# Patient Record
Sex: Female | Born: 1965
Health system: Southern US, Community
[De-identification: ages and names within clinical notes are randomized; demographics above are authoritative.]

---

## 2012-01-09 ENCOUNTER — Ambulatory Visit: Payer: Self-pay | Admitting: Internal Medicine

## 2016-03-10 ENCOUNTER — Other Ambulatory Visit: Payer: Self-pay | Admitting: Internal Medicine

## 2016-03-10 DIAGNOSIS — Z1231 Encounter for screening mammogram for malignant neoplasm of breast: Secondary | ICD-10-CM

## 2016-03-22 ENCOUNTER — Ambulatory Visit
Admission: RE | Admit: 2016-03-22 | Discharge: 2016-03-22 | Disposition: A | Discharge status: Home / Self Care | Payer: Managed Care, Other (non HMO) | Source: Ambulatory Visit | Attending: Internal Medicine | Admitting: Internal Medicine

## 2016-03-22 DIAGNOSIS — R928 Other abnormal and inconclusive findings on diagnostic imaging of breast: Secondary | ICD-10-CM | POA: Insufficient documentation

## 2016-03-22 DIAGNOSIS — Z1231 Encounter for screening mammogram for malignant neoplasm of breast: Secondary | ICD-10-CM

## 2016-03-27 ENCOUNTER — Other Ambulatory Visit: Payer: Self-pay | Admitting: Internal Medicine

## 2016-03-27 DIAGNOSIS — R928 Other abnormal and inconclusive findings on diagnostic imaging of breast: Secondary | ICD-10-CM

## 2016-04-03 ENCOUNTER — Ambulatory Visit
Admission: RE | Admit: 2016-04-03 | Discharge: 2016-04-03 | Disposition: A | Discharge status: Home / Self Care | Payer: Managed Care, Other (non HMO) | Source: Ambulatory Visit | Attending: Internal Medicine | Admitting: Internal Medicine

## 2016-04-03 DIAGNOSIS — N6001 Solitary cyst of right breast: Secondary | ICD-10-CM | POA: Insufficient documentation

## 2016-04-03 DIAGNOSIS — R928 Other abnormal and inconclusive findings on diagnostic imaging of breast: Secondary | ICD-10-CM

## 2016-04-03 DIAGNOSIS — N6002 Solitary cyst of left breast: Secondary | ICD-10-CM | POA: Insufficient documentation

## 2016-04-03 DIAGNOSIS — N63 Unspecified lump in breast: Secondary | ICD-10-CM | POA: Insufficient documentation

## 2016-04-03 DIAGNOSIS — N6489 Other specified disorders of breast: Secondary | ICD-10-CM | POA: Insufficient documentation

## 2018-08-20 ENCOUNTER — Other Ambulatory Visit: Payer: Self-pay | Admitting: Internal Medicine

## 2018-08-20 DIAGNOSIS — Z09 Encounter for follow-up examination after completed treatment for conditions other than malignant neoplasm: Secondary | ICD-10-CM

## 2018-09-04 ENCOUNTER — Encounter: Payer: Self-pay | Admitting: *Deleted

## 2018-12-03 DIAGNOSIS — J209 Acute bronchitis, unspecified: Secondary | ICD-10-CM | POA: Diagnosis not present

## 2018-12-03 DIAGNOSIS — R1310 Dysphagia, unspecified: Secondary | ICD-10-CM | POA: Diagnosis not present

## 2018-12-03 DIAGNOSIS — E8881 Metabolic syndrome: Secondary | ICD-10-CM | POA: Diagnosis not present

## 2019-02-12 DIAGNOSIS — E8881 Metabolic syndrome: Secondary | ICD-10-CM | POA: Diagnosis not present

## 2019-02-12 DIAGNOSIS — I119 Hypertensive heart disease without heart failure: Secondary | ICD-10-CM | POA: Diagnosis not present

## 2019-07-24 DIAGNOSIS — I119 Hypertensive heart disease without heart failure: Secondary | ICD-10-CM | POA: Diagnosis not present

## 2019-07-24 DIAGNOSIS — R1310 Dysphagia, unspecified: Secondary | ICD-10-CM | POA: Diagnosis not present

## 2019-07-24 DIAGNOSIS — E8881 Metabolic syndrome: Secondary | ICD-10-CM | POA: Diagnosis not present

## 2019-10-23 DIAGNOSIS — E669 Obesity, unspecified: Secondary | ICD-10-CM | POA: Diagnosis not present

## 2019-10-23 DIAGNOSIS — F329 Major depressive disorder, single episode, unspecified: Secondary | ICD-10-CM | POA: Diagnosis not present

## 2019-10-23 DIAGNOSIS — R1310 Dysphagia, unspecified: Secondary | ICD-10-CM | POA: Diagnosis not present

## 2019-10-23 DIAGNOSIS — Z23 Encounter for immunization: Secondary | ICD-10-CM | POA: Diagnosis not present

## 2019-10-23 DIAGNOSIS — F419 Anxiety disorder, unspecified: Secondary | ICD-10-CM | POA: Diagnosis not present

## 2019-12-12 ENCOUNTER — Ambulatory Visit: Payer: Managed Care, Other (non HMO) | Attending: Internal Medicine

## 2019-12-12 DIAGNOSIS — Z20822 Contact with and (suspected) exposure to covid-19: Secondary | ICD-10-CM | POA: Diagnosis not present

## 2019-12-13 LAB — NOVEL CORONAVIRUS, NAA: SARS-CoV-2, NAA: NOT DETECTED

## 2020-08-01 DIAGNOSIS — Z20822 Contact with and (suspected) exposure to covid-19: Secondary | ICD-10-CM | POA: Diagnosis not present

## 2020-10-15 ENCOUNTER — Other Ambulatory Visit: Payer: Self-pay

## 2020-10-15 ENCOUNTER — Ambulatory Visit (INDEPENDENT_AMBULATORY_CARE_PROVIDER_SITE_OTHER): Payer: BC Managed Care – PPO | Admitting: Internal Medicine

## 2020-10-15 ENCOUNTER — Encounter: Payer: Self-pay | Admitting: Internal Medicine

## 2020-10-15 VITALS — BP 131/76 | HR 90 | Ht 64.0 in | Wt 224.3 lb

## 2020-10-15 DIAGNOSIS — E669 Obesity, unspecified: Secondary | ICD-10-CM | POA: Diagnosis not present

## 2020-10-15 DIAGNOSIS — R609 Edema, unspecified: Secondary | ICD-10-CM

## 2020-10-15 DIAGNOSIS — I10 Essential (primary) hypertension: Secondary | ICD-10-CM | POA: Insufficient documentation

## 2020-10-15 DIAGNOSIS — J301 Allergic rhinitis due to pollen: Secondary | ICD-10-CM | POA: Diagnosis not present

## 2020-10-15 MED ORDER — LISINOPRIL 20 MG PO TABS: 20.0000 mg | ORAL_TABLET | Freq: Every day | ORAL | 3 refills | Status: DC

## 2020-10-15 MED ORDER — LISINOPRIL-HYDROCHLOROTHIAZIDE 10-12.5 MG PO TABS: 1.0000 | ORAL_TABLET | Freq: Every day | ORAL | 3 refills | Status: DC

## 2020-10-15 NOTE — Assessment & Plan Note (Signed)
-   Today, the patient's blood pressure is well managed on lisinopril. - The patient will continue the current treatment regimen.  - I encouraged the patient to eat a low-sodium diet to help control blood pressure. - I encouraged the patient to live an active lifestyle and complete activities that increases heart rate to 85% target heart rate at least 5 times per week for one hour.     

## 2020-10-15 NOTE — Assessment & Plan Note (Signed)
Patient was advised to use over-the-counter Claritin follow-up allergic rhinitis.

## 2020-10-15 NOTE — Progress Notes (Signed)
Established Patient Office Visit  Subjective:  Patient ID: Sydney Nguyen, female    DOB: [birth year]  Age: 54 y.o. MRN: [id number]  CC:  Chief Complaint  Patient presents with   Edema    feet and ankles    Hypertension    HPI  Sydney Nguyen presents for swelling of the leg and pain in the feet there is no history of any numbness of the feet she can walk all day long and get tired and fatigue of the end of the day she is taking lisinopril 20 mg p.o. daily now.  She has gained some weight lately denies any history of arterial rectal bleeding  History reviewed. No pertinent past medical history.  History reviewed. No pertinent surgical history.  Family History  Problem Relation Age of Onset   Breast cancer Neg Hx     Social History   Socioeconomic History   Marital status: Married    Spouse name: Not on file   Number of children: Not on file   Years of education: Not on file   Highest education level: Not on file  Occupational History   Not on file  Tobacco Use   Smoking status: Never Smoker   Smokeless tobacco: Never Used  Substance and Sexual Activity   Alcohol use: Never   Drug use: Never   Sexual activity: Yes  Other Topics Concern   Not on file  Social History Narrative   Not on file   Social Determinants of Health   Financial Resource Strain:    Difficulty of Paying Living Expenses: Not on file  Food Insecurity:    Worried About [place] in the Last Year: Not on file   Ran Out of Food in the Last Year: Not on file  Transportation Needs:    Lack of Transportation (Medical): Not on file   Lack of Transportation (Non-Medical): Not on file  Physical Activity:    Days of Exercise per Week: Not on file   Minutes of Exercise per Session: Not on file  Stress:    Feeling of Stress : Not on file  Social Connections:    Frequency of Communication with Friends and Family: Not on file   Frequency of Social Gatherings with  Friends and Family: Not on file   Attends Religious Services: Not on file   Active Member of Clubs or Organizations: Not on file   Attends Archivist Meetings: Not on file   Marital Status: Not on file  Intimate Partner Violence:    Fear of Current or Ex-Partner: Not on file   Emotionally Abused: Not on file   Physically Abused: Not on file   Sexually Abused: Not on file     Current Outpatient Medications:    lisinopril-hydrochlorothiazide (ZESTORETIC) 10-12.5 MG tablet, Take 1 tablet by mouth daily., Disp: 90 tablet, Rfl: 3   No Known Allergies  ROS Review of Systems  Constitutional: Negative.   HENT: Negative.  Negative for sinus pressure.   Eyes: Negative.   Respiratory: Negative.  Negative for chest tightness and shortness of breath.   Cardiovascular: Negative.   Gastrointestinal: Negative.   Endocrine: Negative.   Genitourinary: Negative.  Negative for dysuria.  Musculoskeletal: Positive for arthralgias.  Skin: Negative.   Allergic/Immunologic: Negative.   Neurological: Negative.  Negative for syncope and headaches.  Hematological: Negative.   Psychiatric/Behavioral: Negative.  Negative for behavioral problems.  All other systems reviewed and are negative.     Objective:  Physical Exam Vitals reviewed.  Constitutional:      Appearance: Normal appearance.  HENT:     Mouth/Throat:     Mouth: Mucous membranes are moist.  Eyes:     Pupils: Pupils are equal, round, and reactive to light.  Neck:     Vascular: No carotid bruit.  Cardiovascular:     Rate and Rhythm: Normal rate and regular rhythm.     Pulses: Normal pulses.     Heart sounds: Normal heart sounds. No friction rub. No gallop.   Pulmonary:     Effort: Pulmonary effort is normal.     Breath sounds: Normal breath sounds. No rhonchi.  Abdominal:     General: Bowel sounds are normal.     Palpations: Abdomen is soft. There is no hepatomegaly, splenomegaly or mass.     Tenderness:  There is no abdominal tenderness.     Hernia: No hernia is present.  Musculoskeletal:        General: Swelling present. No tenderness. Normal range of motion.     Cervical back: Neck supple.     Right lower leg: Edema present.     Left lower leg: Edema present.  Skin:    General: Skin is warm and dry.     Capillary Refill: Capillary refill takes 2 to 3 seconds.     Coloration: Skin is not jaundiced.     Findings: No bruising, lesion or rash.  Neurological:     Mental Status: She is alert and oriented to person, place, and time.     Motor: No weakness.  Psychiatric:        Mood and Affect: Mood and affect normal.        Behavior: Behavior normal.     BP 131/76    Pulse 90    Ht _0  (1.626 m)    Wt 224 lb 4.8 oz (101.7 kg)    BMI 38.50 kg/m  Wt Readings from Last 3 Encounters:  10/15/20 224 lb 4.8 oz (101.7 kg)     Health Maintenance Due  Topic Date Due   Hepatitis C Screening  Never done   COVID-19 Vaccine (1) Never done   HIV Screening  Never done   TETANUS/TDAP  Never done   PAP SMEAR-Modifier  Never done   MAMMOGRAM  06/17/2018   COLONOSCOPY  Never done   INFLUENZA VACCINE  Never done    There are no preventive care reminders to display for this patient.  No results found for: TSH No results found for: WBC, HGB, HCT, MCV, PLT No results found for: NA, K, CHLORIDE, CO2, GLUCOSE, BUN, CREATININE, BILITOT, ALKPHOS, AST, ALT, PROT, ALBUMIN, CALCIUM, ANIONGAP, EGFR, GFR No results found for: CHOL No results found for: HDL No results found for: LDLCALC No results found for: TRIG No results found for: CHOLHDL No results found for: HGBA1C    Assessment & Plan:   Problem List Items Addressed This Visit      Cardiovascular and Mediastinum   Essential hypertension - Primary    - Today, the patient's blood pressure is well managed on lisinopril. - The patient will continue the current treatment regimen.  - I encouraged the patient to eat a low-sodium diet  to help control blood pressure. - I encouraged the patient to live an active lifestyle and complete activities that increases heart rate to 85% target heart rate at least 5 times per week for one hour.          Relevant  Medications   lisinopril-hydrochlorothiazide (ZESTORETIC) 10-12.5 MG tablet     Respiratory   Seasonal allergic rhinitis due to pollen    Patient was advised to use over-the-counter Claritin follow-up allergic rhinitis.        Other   Edema    We will start the patient on lisinopril with hydrochlorothiazide to protect of excessive fluid.  She was also advised to take 1000 units of vitamin D every day.      Obesity (BMI 35.0-39.9 without comorbidity)    - I encouraged the patient to lose weight.  - I educated them on making healthy dietary choices including eating more fruits and vegetables and less fried foods. - I encouraged the patient to exercise more, and educated on the benefits of exercise including weight loss, diabetes prevention, and hypertension prevention.           Meds ordered this encounter  Medications   DISCONTD: lisinopril (ZESTRIL) 20 MG tablet    Sig: Take 1 tablet (20 mg total) by mouth daily.    Dispense:  90 tablet    Refill:  3   lisinopril-hydrochlorothiazide (ZESTORETIC) 10-12.5 MG tablet    Sig: Take 1 tablet by mouth daily.    Dispense:  90 tablet    Refill:  3    Follow-up: No follow-ups on file.    Cletis Athens, MD

## 2020-10-15 NOTE — Assessment & Plan Note (Signed)
-   I encouraged the patient to lose weight.  - I educated them on making healthy dietary choices including eating more fruits and vegetables and less fried foods. - I encouraged the patient to exercise more, and educated on the benefits of exercise including weight loss, diabetes prevention, and hypertension prevention.   

## 2020-10-15 NOTE — Assessment & Plan Note (Signed)
We will start the patient on lisinopril with hydrochlorothiazide to protect of excessive fluid.  She was also advised to take 1000 units of vitamin D every day.

## 2020-10-22 ENCOUNTER — Ambulatory Visit (INDEPENDENT_AMBULATORY_CARE_PROVIDER_SITE_OTHER): Payer: BC Managed Care – PPO | Admitting: Internal Medicine

## 2020-10-22 DIAGNOSIS — I10 Essential (primary) hypertension: Secondary | ICD-10-CM | POA: Diagnosis not present

## 2020-10-22 DIAGNOSIS — R609 Edema, unspecified: Secondary | ICD-10-CM

## 2020-10-22 DIAGNOSIS — E669 Obesity, unspecified: Secondary | ICD-10-CM

## 2020-10-23 LAB — CBC WITH DIFFERENTIAL/PLATELET
Absolute Monocytes: 438 cells/uL (ref 200–950)
Basophils Absolute: 42 cells/uL (ref 0–200)
Basophils Relative: 0.7 %
Eosinophils Absolute: 108 cells/uL (ref 15–500)
Eosinophils Relative: 1.8 %
HCT: 44.5 % (ref 35.0–45.0)
Hemoglobin: 15.3 g/dL (ref 11.7–15.5)
Lymphs Abs: 1692 cells/uL (ref 850–3900)
MCH: 31.5 pg (ref 27.0–33.0)
MCHC: 34.4 g/dL (ref 32.0–36.0)
MCV: 91.6 fL (ref 80.0–100.0)
MPV: 10.7 fL (ref 7.5–12.5)
Monocytes Relative: 7.3 %
Neutro Abs: 3720 cells/uL (ref 1500–7800)
Neutrophils Relative %: 62 %
Platelets: 346 10*3/uL (ref 140–400)
RBC: 4.86 10*6/uL (ref 3.80–5.10)
RDW: 11.8 % (ref 11.0–15.0)
Total Lymphocyte: 28.2 %
WBC: 6 10*3/uL (ref 3.8–10.8)

## 2020-10-23 LAB — COMPLETE METABOLIC PANEL WITH GFR
AG Ratio: 1.5 (calc) (ref 1.0–2.5)
ALT: 68 U/L — ABNORMAL HIGH (ref 6–29)
AST: 42 U/L — ABNORMAL HIGH (ref 10–35)
Albumin: 4.4 g/dL (ref 3.6–5.1)
Alkaline phosphatase (APISO): 86 U/L (ref 37–153)
BUN: 18 mg/dL (ref 7–25)
CO2: 27 mmol/L (ref 20–32)
Calcium: 9.8 mg/dL (ref 8.6–10.4)
Chloride: 99 mmol/L (ref 98–110)
Creat: 0.78 mg/dL (ref 0.50–1.05)
GFR, Est African American: 101 mL/min/{1.73_m2} (ref >=60)
GFR, Est Non African American: 87 mL/min/{1.73_m2} (ref >=60)
Globulin: 2.9 g/dL (calc) (ref 1.9–3.7)
Glucose, Bld: 100 mg/dL — ABNORMAL HIGH (ref 65–99)
Potassium: 4.5 mmol/L (ref 3.5–5.3)
Sodium: 136 mmol/L (ref 135–146)
Total Bilirubin: 0.5 mg/dL (ref 0.2–1.2)
Total Protein: 7.3 g/dL (ref 6.1–8.1)

## 2020-10-23 LAB — LIPID PANEL
Cholesterol: 176 mg/dL (ref <200)
HDL: 50 mg/dL (ref >=50)
LDL Cholesterol (Calc): 107 mg/dL (calc) — ABNORMAL HIGH
Non-HDL Cholesterol (Calc): 126 mg/dL (calc) (ref <130)
Total CHOL/HDL Ratio: 3.5 (calc) (ref <5.0)
Triglycerides: 95 mg/dL (ref <150)

## 2020-10-23 LAB — TSH: TSH: 0.67 mIU/L

## 2021-02-14 ENCOUNTER — Other Ambulatory Visit: Payer: Self-pay

## 2021-02-14 ENCOUNTER — Ambulatory Visit (INDEPENDENT_AMBULATORY_CARE_PROVIDER_SITE_OTHER): Payer: BC Managed Care – PPO | Admitting: Internal Medicine

## 2021-02-14 ENCOUNTER — Encounter: Payer: Self-pay | Admitting: Internal Medicine

## 2021-02-14 VITALS — BP 135/82 | HR 79 | Ht 63.0 in | Wt 219.8 lb

## 2021-02-14 DIAGNOSIS — E669 Obesity, unspecified: Secondary | ICD-10-CM

## 2021-02-14 DIAGNOSIS — R609 Edema, unspecified: Secondary | ICD-10-CM | POA: Diagnosis not present

## 2021-02-14 DIAGNOSIS — I10 Essential (primary) hypertension: Secondary | ICD-10-CM | POA: Diagnosis not present

## 2021-02-14 DIAGNOSIS — J301 Allergic rhinitis due to pollen: Secondary | ICD-10-CM

## 2021-02-14 NOTE — Assessment & Plan Note (Signed)
Take Claritin 10 mg daily 

## 2021-02-14 NOTE — Progress Notes (Signed)
Established Patient Office Visit  Subjective:  Patient ID: Sydney Nguyen, female    DOB: 02/10/1966  Age: 55 y.o. MRN: [id number]  CC:  Chief Complaint  Patient presents with  . Hypertension    HPI  Sydney Nguyen presents forcheck up,  Patient is known to have hypertensive cardiovascular disease allergic rhinitis and has problem with obesity.  She also complains of lack of concentration.  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 History reviewed. No pertinent past medical history.  History reviewed. No pertinent surgical history.  Family History  Problem Relation Age of Onset  . Breast cancer Neg Hx     Social History   Socioeconomic History  . Marital status: Married    Spouse name: Not on file  . Number of children: Not on file  . Years of education: Not on file  . Highest education level: Not on file  Occupational History  . Not on file  Tobacco Use  . Smoking status: Never Smoker  . Smokeless tobacco: Never Used  Substance and Sexual Activity  . Alcohol use: Never  . Drug use: Never  . Sexual activity: Yes  Other Topics Concern  . Not on file  Social History Narrative  . Not on file   Social Determinants of Health   Financial Resource Strain: Not on file  Food Insecurity: Not on file  Transportation Needs: Not on file  Physical Activity: Not on file  Stress: Not on file  Social Connections: Not on file  Intimate Partner Violence: Not on file     Current Outpatient Medications:  .  lisinopril-hydrochlorothiazide (ZESTORETIC) 10-12.5 MG tablet, Take 1 tablet by mouth daily., Disp: 90 tablet, Rfl: 3   No Known Allergies  ROS Review of Systems  Constitutional: Negative.   HENT: Negative.   Eyes: Negative.   Respiratory: Negative.   Cardiovascular: Negative.   Gastrointestinal: Negative.   Endocrine: Negative.   Genitourinary: Negative.   Musculoskeletal: Negative.   Skin: Negative.   Allergic/Immunologic:  Negative.   Neurological: Negative.   Hematological: Negative.   Psychiatric/Behavioral: Negative.   All other systems reviewed and are negative.     Objective:    Physical Exam Vitals reviewed.  Constitutional:      Appearance: Normal appearance.  HENT:     Mouth/Throat:     Mouth: Mucous membranes are moist.  Eyes:     Pupils: Pupils are equal, round, and reactive to light.  Neck:     Vascular: No carotid bruit.  Cardiovascular:     Rate and Rhythm: Normal rate and regular rhythm.     Pulses: Normal pulses.     Heart sounds: Normal heart sounds.  Pulmonary:     Effort: Pulmonary effort is normal.     Breath sounds: Normal breath sounds.  Abdominal:     General: Bowel sounds are normal.     Palpations: Abdomen is soft. There is no hepatomegaly, splenomegaly or mass.     Tenderness: There is no abdominal tenderness.     Hernia: No hernia is present.  Musculoskeletal:        General: No tenderness.     Cervical back: Neck supple.     Right lower leg: No edema.     Left lower leg: No edema.  Skin:    Findings: No rash.  Neurological:     Mental Status: She is alert and oriented to person, place, and time.  Motor: No weakness.  Psychiatric:        Mood and Affect: Mood and affect normal.        Behavior: Behavior normal.     BP 135/82   Pulse 79   Ht 5\' 3"  (1.6 m)   Wt 219 lb 12.8 oz (99.7 kg)   BMI 38.94 kg/m  Wt Readings from Last 3 Encounters:  02/14/21 219 lb 12.8 oz (99.7 kg)  10/15/20 224 lb 4.8 oz (101.7 kg)     Health Maintenance Due  Topic Date Due  . Hepatitis C Screening  Never done  . COVID-19 Vaccine (1) Never done  . HIV Screening  Never done  . TETANUS/TDAP  Never done  . PAP SMEAR-Modifier  Never done  . COLONOSCOPY (Pts 45-66yrs Insurance coverage will need to be confirmed)  Never done  . MAMMOGRAM  06/17/2018    There are no preventive care reminders to display for this patient.  Lab Results  Component Value Date   TSH 0.67  10/22/2020   Lab Results  Component Value Date   WBC 6.0 10/22/2020   HGB 15.3 10/22/2020   HCT 44.5 10/22/2020   MCV 91.6 10/22/2020   PLT 346 10/22/2020   Lab Results  Component Value Date   NA 136 10/22/2020   K 4.5 10/22/2020   CO2 27 10/22/2020   GLUCOSE 100 (H) 10/22/2020   BUN 18 10/22/2020   CREATININE 0.78 10/22/2020   BILITOT 0.5 10/22/2020   AST 42 (H) 10/22/2020   ALT 68 (H) 10/22/2020   PROT 7.3 10/22/2020   CALCIUM 9.8 10/22/2020   Lab Results  Component Value Date   CHOL 176 10/22/2020   Lab Results  Component Value Date   HDL 50 10/22/2020   Lab Results  Component Value Date   LDLCALC 107 (H) 10/22/2020   Lab Results  Component Value Date   TRIG 95 10/22/2020   Lab Results  Component Value Date   CHOLHDL 3.5 10/22/2020   No results found for: HGBA1C    Assessment & Plan:   Problem List Items Addressed This Visit      Cardiovascular and Mediastinum   Essential hypertension - Primary    Patient blood pressure is normal patient denies any chest pain or shortness of breath there is no history of palpitation paroxysmal nocturnal dyspnea patient can walkioo yards without any problem patient was advised to follow low-salt low-cholesterol diet  I reviewed the results of Sprint trial  ideally I want to keep systolic blood pressure below 14/08/2020 mmHg, patient was asked to check blood pressure 3 times a week and give me a report on that.  Patient will be follow-up in 3 months, patient will call me back for any change in the cardiovascular symptoms           Respiratory   Seasonal allergic rhinitis due to pollen    Take Claritin 10 mg daily.        Other   Edema    Edema is resolved.      Obesity (BMI 35.0-39.9 without comorbidity)    - I encouraged the patient to lose weight.  - I educated them on making healthy dietary choices including eating more fruits and vegetables and less fried foods. - I encouraged the patient to exercise more, and  educated on the benefits of exercise including weight loss, diabetes prevention, and hypertension prevention.   Dietary counseling with a registered dietician  Referral to a weight management support group (  e.g. Weight Watchers, Overeaters Anonymous)  If your BMI is greater than 29 or you have gained more than 15 pounds you should work on weight loss.  Attend a healthy cooking class          No orders of the defined types were placed in this encounter.   Follow-up: No follow-ups on file.    Corky Downs, MD

## 2021-02-14 NOTE — Assessment & Plan Note (Signed)

## 2021-02-14 NOTE — Assessment & Plan Note (Signed)

## 2021-02-14 NOTE — Assessment & Plan Note (Signed)
Edema is resolved.

## 2021-04-01 ENCOUNTER — Other Ambulatory Visit: Payer: Self-pay

## 2021-04-01 ENCOUNTER — Ambulatory Visit
Admission: RE | Admit: 2021-04-01 | Discharge: 2021-04-01 | Disposition: A | Discharge status: Home / Self Care | Payer: BC Managed Care – PPO | Source: Ambulatory Visit | Attending: Internal Medicine | Admitting: Internal Medicine

## 2021-04-01 ENCOUNTER — Ambulatory Visit
Admission: RE | Admit: 2021-04-01 | Discharge: 2021-04-01 | Disposition: A | Discharge status: Home / Self Care | Payer: BC Managed Care – PPO | Source: Ambulatory Visit

## 2021-04-01 ENCOUNTER — Other Ambulatory Visit: Payer: Self-pay | Admitting: Internal Medicine

## 2021-04-01 DIAGNOSIS — Z1239 Encounter for other screening for malignant neoplasm of breast: Secondary | ICD-10-CM

## 2021-04-01 DIAGNOSIS — Z139 Encounter for screening, unspecified: Secondary | ICD-10-CM

## 2021-04-04 ENCOUNTER — Encounter: Payer: Self-pay | Admitting: Internal Medicine

## 2021-04-08 ENCOUNTER — Other Ambulatory Visit: Payer: Self-pay | Admitting: Internal Medicine

## 2021-04-08 DIAGNOSIS — Z1231 Encounter for screening mammogram for malignant neoplasm of breast: Secondary | ICD-10-CM | POA: Diagnosis not present

## 2021-04-08 DIAGNOSIS — Z1239 Encounter for other screening for malignant neoplasm of breast: Secondary | ICD-10-CM

## 2021-04-14 ENCOUNTER — Other Ambulatory Visit: Payer: Self-pay | Admitting: Internal Medicine

## 2021-04-14 DIAGNOSIS — Z1239 Encounter for other screening for malignant neoplasm of breast: Secondary | ICD-10-CM

## 2021-04-21 ENCOUNTER — Other Ambulatory Visit: Payer: Self-pay | Admitting: Internal Medicine

## 2021-04-21 DIAGNOSIS — Z1239 Encounter for other screening for malignant neoplasm of breast: Secondary | ICD-10-CM

## 2021-04-28 ENCOUNTER — Ambulatory Visit
Admission: RE | Admit: 2021-04-28 | Discharge: 2021-04-28 | Disposition: A | Discharge status: Home / Self Care | Payer: BC Managed Care – PPO | Source: Ambulatory Visit | Attending: Internal Medicine | Admitting: Internal Medicine

## 2021-04-28 ENCOUNTER — Other Ambulatory Visit: Payer: Self-pay | Admitting: Internal Medicine

## 2021-04-28 DIAGNOSIS — Z1239 Encounter for other screening for malignant neoplasm of breast: Secondary | ICD-10-CM

## 2021-04-29 ENCOUNTER — Other Ambulatory Visit: Payer: Self-pay | Admitting: Internal Medicine

## 2021-04-29 DIAGNOSIS — R928 Other abnormal and inconclusive findings on diagnostic imaging of breast: Secondary | ICD-10-CM

## 2021-05-06 ENCOUNTER — Ambulatory Visit
Admission: RE | Admit: 2021-05-06 | Discharge: 2021-05-06 | Disposition: A | Discharge status: Home / Self Care | Payer: BC Managed Care – PPO | Source: Ambulatory Visit | Attending: Internal Medicine | Admitting: Internal Medicine

## 2021-05-06 ENCOUNTER — Other Ambulatory Visit: Payer: Self-pay

## 2021-05-06 DIAGNOSIS — N6489 Other specified disorders of breast: Secondary | ICD-10-CM | POA: Diagnosis not present

## 2021-05-06 DIAGNOSIS — N6012 Diffuse cystic mastopathy of left breast: Secondary | ICD-10-CM | POA: Diagnosis not present

## 2021-05-06 DIAGNOSIS — R928 Other abnormal and inconclusive findings on diagnostic imaging of breast: Secondary | ICD-10-CM | POA: Diagnosis not present

## 2021-05-19 DIAGNOSIS — M9901 Segmental and somatic dysfunction of cervical region: Secondary | ICD-10-CM | POA: Diagnosis not present

## 2021-05-19 DIAGNOSIS — M25552 Pain in left hip: Secondary | ICD-10-CM | POA: Diagnosis not present

## 2021-05-19 DIAGNOSIS — M5413 Radiculopathy, cervicothoracic region: Secondary | ICD-10-CM | POA: Diagnosis not present

## 2021-05-19 DIAGNOSIS — M25562 Pain in left knee: Secondary | ICD-10-CM | POA: Diagnosis not present

## 2021-05-20 DIAGNOSIS — M25562 Pain in left knee: Secondary | ICD-10-CM | POA: Diagnosis not present

## 2021-05-20 DIAGNOSIS — M25552 Pain in left hip: Secondary | ICD-10-CM | POA: Diagnosis not present

## 2021-05-20 DIAGNOSIS — M9901 Segmental and somatic dysfunction of cervical region: Secondary | ICD-10-CM | POA: Diagnosis not present

## 2021-05-20 DIAGNOSIS — M5413 Radiculopathy, cervicothoracic region: Secondary | ICD-10-CM | POA: Diagnosis not present

## 2021-05-23 ENCOUNTER — Other Ambulatory Visit: Payer: Self-pay

## 2021-05-23 ENCOUNTER — Ambulatory Visit: Payer: BC Managed Care – PPO | Admitting: Internal Medicine

## 2021-05-23 ENCOUNTER — Encounter: Payer: Self-pay | Admitting: Internal Medicine

## 2021-05-23 VITALS — BP 134/81 | HR 80 | Ht 63.0 in | Wt 212.4 lb

## 2021-05-23 DIAGNOSIS — Z0000 Encounter for general adult medical examination without abnormal findings: Secondary | ICD-10-CM

## 2021-05-23 DIAGNOSIS — M25562 Pain in left knee: Secondary | ICD-10-CM | POA: Diagnosis not present

## 2021-05-23 DIAGNOSIS — J301 Allergic rhinitis due to pollen: Secondary | ICD-10-CM

## 2021-05-23 DIAGNOSIS — R609 Edema, unspecified: Secondary | ICD-10-CM

## 2021-05-23 DIAGNOSIS — I10 Essential (primary) hypertension: Secondary | ICD-10-CM | POA: Diagnosis not present

## 2021-05-23 DIAGNOSIS — M5413 Radiculopathy, cervicothoracic region: Secondary | ICD-10-CM | POA: Diagnosis not present

## 2021-05-23 DIAGNOSIS — E669 Obesity, unspecified: Secondary | ICD-10-CM | POA: Diagnosis not present

## 2021-05-23 DIAGNOSIS — M25552 Pain in left hip: Secondary | ICD-10-CM | POA: Diagnosis not present

## 2021-05-23 DIAGNOSIS — M9901 Segmental and somatic dysfunction of cervical region: Secondary | ICD-10-CM | POA: Diagnosis not present

## 2021-05-23 NOTE — Progress Notes (Signed)
Established Patient Office Visit  Subjective:  Patient ID: Sydney Nguyen, female    DOB: February 24, 1966  Age: 55 y.o. MRN: [id number]  CC:  Chief Complaint  Patient presents with   Hypertension      Sydney Nguyen presents for general check , she sees   chriopractice for   rt knee pain , need coonoscopy and ob check  History reviewed. No pertinent past medical history.  History reviewed. No pertinent surgical history.  Family History  Problem Relation Age of Onset   Breast cancer Neg Hx     Social History   Socioeconomic History   Marital status: Married    Spouse name: Not on file   Number of children: Not on file   Years of education: Not on file   Highest education level: Not on file  Occupational History   Not on file  Tobacco Use   Smoking status: Never   Smokeless tobacco: Never  Substance and Sexual Activity   Alcohol use: Never   Drug use: Never   Sexual activity: Yes  Other Topics Concern   Not on file  Social History Narrative   ** Merged History Encounter **       Social Determinants of Health   Financial Resource Strain: Not on file  Food Insecurity: Not on file  Transportation Needs: Not on file  Physical Activity: Not on file  Stress: Not on file  Social Connections: Not on file  Intimate Partner Violence: Not on file     Current Outpatient Medications:    lisinopril-hydrochlorothiazide (ZESTORETIC) 10-12.5 MG tablet, Take 1 tablet by mouth daily., Disp: 90 tablet, Rfl: 3   No Known Allergies  ROS Review of Systems  Constitutional: Negative.   HENT: Negative.    Eyes: Negative.   Respiratory: Negative.    Cardiovascular: Negative.   Gastrointestinal: Negative.   Endocrine: Negative.   Genitourinary: Negative.   Musculoskeletal: Negative.   Skin: Negative.   Allergic/Immunologic: Negative.   Neurological: Negative.   Hematological: Negative.   Psychiatric/Behavioral: Negative.    All other systems reviewed and  are negative.    Objective:    Physical Exam Vitals reviewed.  Constitutional:      Appearance: Normal appearance.  HENT:     Mouth/Throat:     Mouth: Mucous membranes are moist.  Eyes:     Pupils: Pupils are equal, round, and reactive to light.  Neck:     Vascular: No carotid bruit.  Cardiovascular:     Rate and Rhythm: Normal rate and regular rhythm.     Pulses: Normal pulses.     Heart sounds: Normal heart sounds.  Pulmonary:     Effort: Pulmonary effort is normal.     Breath sounds: Normal breath sounds.  Abdominal:     General: Bowel sounds are normal.     Palpations: Abdomen is soft. There is no hepatomegaly, splenomegaly or mass.     Tenderness: There is no abdominal tenderness.     Hernia: No hernia is present.  Musculoskeletal:        General: No tenderness.     Cervical back: Neck supple.     Right lower leg: No edema.     Left lower leg: No edema.  Skin:    Findings: No rash.  Neurological:     Mental Status: She is alert and oriented to person, place, and time.     Motor: No weakness.  Psychiatric:  Mood and Affect: Mood and affect normal.        Behavior: Behavior normal.    BP 134/81   Pulse 80   Ht 5\' 3"  (1.6 m)   Wt 212 lb 5.8 oz (96.3 kg)   BMI 37.62 kg/m  Wt Readings from Last 3 Encounters:  05/23/21 212 lb 5.8 oz (96.3 kg)  02/14/21 219 lb 12.8 oz (99.7 kg)  10/15/20 224 lb 4.8 oz (101.7 kg)     Health Maintenance Due  Topic Date Due   COVID-19 Vaccine (1) Never done   HIV Screening  Never done   Hepatitis C Screening  Never done   TETANUS/TDAP  Never done   PAP SMEAR-Modifier  Never done   COLONOSCOPY (Pts 45-72yrs Insurance coverage will need to be confirmed)  Never done   Zoster Vaccines- Shingrix (1 of 2) Never done    There are no preventive care reminders to display for this patient.  Lab Results  Component Value Date   TSH 0.67 10/22/2020   Lab Results  Component Value Date   WBC 6.0 10/22/2020   HGB 15.3  10/22/2020   HCT 44.5 10/22/2020   MCV 91.6 10/22/2020   PLT 346 10/22/2020   Lab Results  Component Value Date   NA 136 10/22/2020   K 4.5 10/22/2020   CO2 27 10/22/2020   GLUCOSE 100 (H) 10/22/2020   BUN 18 10/22/2020   CREATININE 0.78 10/22/2020   BILITOT 0.5 10/22/2020   AST 42 (H) 10/22/2020   ALT 68 (H) 10/22/2020   PROT 7.3 10/22/2020   CALCIUM 9.8 10/22/2020   Lab Results  Component Value Date   CHOL 176 10/22/2020   Lab Results  Component Value Date   HDL 50 10/22/2020   Lab Results  Component Value Date   LDLCALC 107 (H) 10/22/2020   Lab Results  Component Value Date   TRIG 95 10/22/2020   Lab Results  Component Value Date   CHOLHDL 3.5 10/22/2020   No results found for: HGBA1C    Assessment & Plan:   Problem List Items Addressed This Visit       Cardiovascular and Mediastinum   Essential hypertension     Patient denies any chest pain or shortness of breath there is no history of palpitation or paroxysmal nocturnal dyspnea   patient was advised to follow low-salt low-cholesterol diet    ideally I want to keep systolic blood pressure below 14/08/2020 mmHg, patient was asked to check blood pressure one times a week and give me a report on that.  Patient will be follow-up in 3 months  or earlier as needed, patient will call me back for any change in the cardiovascular symptoms Patient was advised to buy a book from local bookstore concerning blood pressure and read several chapters  every day.  This will be supplemented by some of the material we will give him from the office.  Patient should also utilize other resources like YouTube and Internet to learn more about the blood pressure and the diet.         Respiratory   Seasonal allergic rhinitis due to pollen    Take Claritin 5 mg p.o. daily as needed         Other   Edema    Suggest to follow low-salt diet.       Obesity (BMI 35.0-39.9 without comorbidity)    - I encouraged the patient to lose  weight.  - I educated them on  making healthy dietary choices including eating more fruits and vegetables and less fried foods. - I encouraged the patient to exercise more, and educated on the benefits of exercise including weight loss, diabetes prevention, and hypertension prevention.   Dietary counseling with a registered dietician  Referral to a weight management support group (e.g. Weight Watchers, Overeaters Anonymous)  If your BMI is greater than 29 or you have gained more than 15 pounds you should work on weight loss.  Attend a healthy cooking class        Other Visit Diagnoses     Annual physical exam    -  Primary   Relevant Orders   Ambulatory referral to Obstetrics / Gynecology   Ambulatory referral to Gastroenterology       No orders of the defined types were placed in this encounter.   Follow-up: No follow-ups on file.    Corky Downs, MD

## 2021-05-24 ENCOUNTER — Encounter: Payer: Self-pay | Admitting: Internal Medicine

## 2021-05-24 NOTE — Assessment & Plan Note (Signed)

## 2021-05-24 NOTE — Assessment & Plan Note (Signed)
Suggest to follow low-salt diet.

## 2021-05-24 NOTE — Assessment & Plan Note (Signed)
Take Claritin 5 mg p.o. daily as needed 

## 2021-05-24 NOTE — Assessment & Plan Note (Signed)

## 2021-05-27 DIAGNOSIS — M9901 Segmental and somatic dysfunction of cervical region: Secondary | ICD-10-CM | POA: Diagnosis not present

## 2021-05-27 DIAGNOSIS — M25562 Pain in left knee: Secondary | ICD-10-CM | POA: Diagnosis not present

## 2021-05-27 DIAGNOSIS — M5413 Radiculopathy, cervicothoracic region: Secondary | ICD-10-CM | POA: Diagnosis not present

## 2021-05-27 DIAGNOSIS — M25552 Pain in left hip: Secondary | ICD-10-CM | POA: Diagnosis not present

## 2021-06-01 ENCOUNTER — Encounter: Payer: Self-pay | Admitting: *Deleted

## 2021-07-01 DIAGNOSIS — M25552 Pain in left hip: Secondary | ICD-10-CM | POA: Diagnosis not present

## 2021-07-01 DIAGNOSIS — M5413 Radiculopathy, cervicothoracic region: Secondary | ICD-10-CM | POA: Diagnosis not present

## 2021-07-01 DIAGNOSIS — M9901 Segmental and somatic dysfunction of cervical region: Secondary | ICD-10-CM | POA: Diagnosis not present

## 2021-07-01 DIAGNOSIS — M25562 Pain in left knee: Secondary | ICD-10-CM | POA: Diagnosis not present

## 2021-08-22 ENCOUNTER — Encounter: Payer: BC Managed Care – PPO | Admitting: Internal Medicine

## 2021-09-16 ENCOUNTER — Other Ambulatory Visit: Payer: Self-pay | Admitting: *Deleted

## 2021-09-16 MED ORDER — METHYLPREDNISOLONE 4 MG PO TBPK: ORAL_TABLET | ORAL | 0 refills | Status: DC

## 2021-09-16 MED ORDER — AZITHROMYCIN 250 MG PO TABS: ORAL_TABLET | ORAL | 0 refills | Status: AC

## 2021-12-07 ENCOUNTER — Encounter: Payer: Self-pay | Admitting: Internal Medicine

## 2021-12-07 ENCOUNTER — Ambulatory Visit (INDEPENDENT_AMBULATORY_CARE_PROVIDER_SITE_OTHER): Payer: Managed Care, Other (non HMO) | Admitting: Internal Medicine

## 2021-12-07 ENCOUNTER — Other Ambulatory Visit: Payer: Self-pay

## 2021-12-07 VITALS — Ht 63.0 in | Wt 213.0 lb

## 2021-12-07 DIAGNOSIS — Z1159 Encounter for screening for other viral diseases: Secondary | ICD-10-CM | POA: Diagnosis not present

## 2021-12-07 DIAGNOSIS — E669 Obesity, unspecified: Secondary | ICD-10-CM

## 2021-12-07 DIAGNOSIS — I10 Essential (primary) hypertension: Secondary | ICD-10-CM

## 2021-12-07 DIAGNOSIS — Z114 Encounter for screening for human immunodeficiency virus [HIV]: Secondary | ICD-10-CM | POA: Diagnosis not present

## 2021-12-07 DIAGNOSIS — J301 Allergic rhinitis due to pollen: Secondary | ICD-10-CM

## 2021-12-07 DIAGNOSIS — R609 Edema, unspecified: Secondary | ICD-10-CM

## 2021-12-07 MED ORDER — LOSARTAN POTASSIUM-HCTZ 50-12.5 MG PO TABS: 1.0000 | ORAL_TABLET | Freq: Every day | ORAL | 1 refills | Status: DC

## 2021-12-07 NOTE — Assessment & Plan Note (Signed)
Take Claritin 5 mg p.o. daily 

## 2021-12-07 NOTE — Assessment & Plan Note (Signed)

## 2021-12-07 NOTE — Progress Notes (Signed)
Established Patient Office Visit  Subjective:  Patient ID: Sydney Nguyen, female    DOB: Jul 26, 1966  Age: 56 y.o. MRN: [id number]  CC:  Chief Complaint  Patient presents with   Follow-up    HPI  Sydney Nguyen presents for check up  History reviewed. No pertinent past medical history.  History reviewed. No pertinent surgical history.  Family History  Problem Relation Age of Onset   Breast cancer Neg Hx     Social History   Socioeconomic History   Marital status: Married    Spouse name: Not on file   Number of children: Not on file   Years of education: Not on file   Highest education level: Not on file  Occupational History   Not on file  Tobacco Use   Smoking status: Never   Smokeless tobacco: Never  Substance and Sexual Activity   Alcohol use: Never   Drug use: Never   Sexual activity: Yes  Other Topics Concern   Not on file  Social History Narrative   ** Merged History Encounter **       Social Determinants of Health   Financial Resource Strain: Not on file  Food Insecurity: Not on file  Transportation Needs: Not on file  Physical Activity: Not on file  Stress: Not on file  Social Connections: Not on file  Intimate Partner Violence: Not on file     Current Outpatient Medications:    lisinopril-hydrochlorothiazide (ZESTORETIC) 10-12.5 MG tablet, Take 1 tablet by mouth daily., Disp: 90 tablet, Rfl: 3   No Known Allergies  ROS Review of Systems  Constitutional: Negative.   HENT: Negative.    Eyes: Negative.   Respiratory: Negative.    Cardiovascular: Negative.   Gastrointestinal: Negative.   Endocrine: Negative.   Genitourinary: Negative.   Musculoskeletal: Negative.   Skin: Negative.   Allergic/Immunologic: Negative.   Neurological: Negative.   Hematological: Negative.   Psychiatric/Behavioral: Negative.    All other systems reviewed and are negative.    Objective:    Physical Exam Vitals reviewed.  Constitutional:       Appearance: Normal appearance.  HENT:     Mouth/Throat:     Mouth: Mucous membranes are moist.  Eyes:     Pupils: Pupils are equal, round, and reactive to light.  Neck:     Vascular: No carotid bruit.  Cardiovascular:     Rate and Rhythm: Normal rate and regular rhythm.     Pulses: Normal pulses.     Heart sounds: Normal heart sounds.  Pulmonary:     Effort: Pulmonary effort is normal.     Breath sounds: Normal breath sounds.  Abdominal:     General: Bowel sounds are normal.     Palpations: Abdomen is soft. There is no hepatomegaly, splenomegaly or mass.     Tenderness: There is no abdominal tenderness.     Hernia: No hernia is present.  Musculoskeletal:        General: No tenderness.     Cervical back: Neck supple.     Right lower leg: No edema.     Left lower leg: No edema.  Skin:    Findings: No rash.  Neurological:     Mental Status: She is alert and oriented to person, place, and time.     Motor: No weakness.  Psychiatric:        Mood and Affect: Mood and affect normal.        Behavior: Behavior normal.  Ht 5\' 3"  (1.6 m)    Wt 213 lb (96.6 kg)    BMI 37.73 kg/m  Wt Readings from Last 3 Encounters:  12/07/21 213 lb (96.6 kg)  05/23/21 212 lb 5.8 oz (96.3 kg)  02/14/21 219 lb 12.8 oz (99.7 kg)     Health Maintenance Due  Topic Date Due   Hepatitis C Screening  Never done    There are no preventive care reminders to display for this patient.  Lab Results  Component Value Date   TSH 0.67 10/22/2020   Lab Results  Component Value Date   WBC 6.0 10/22/2020   HGB 15.3 10/22/2020   HCT 44.5 10/22/2020   MCV 91.6 10/22/2020   PLT 346 10/22/2020   Lab Results  Component Value Date   NA 136 10/22/2020   K 4.5 10/22/2020   CO2 27 10/22/2020   GLUCOSE 100 (H) 10/22/2020   BUN 18 10/22/2020   CREATININE 0.78 10/22/2020   BILITOT 0.5 10/22/2020   AST 42 (H) 10/22/2020   ALT 68 (H) 10/22/2020   PROT 7.3 10/22/2020   CALCIUM 9.8 10/22/2020   Lab  Results  Component Value Date   CHOL 176 10/22/2020   Lab Results  Component Value Date   HDL 50 10/22/2020   Lab Results  Component Value Date   LDLCALC 107 (H) 10/22/2020   Lab Results  Component Value Date   TRIG 95 10/22/2020   Lab Results  Component Value Date   CHOLHDL 3.5 10/22/2020   No results found for: HGBA1C    Assessment & Plan:   Problem List Items Addressed This Visit       Cardiovascular and Mediastinum   Essential hypertension - Primary     Patient denies any chest pain or shortness of breath there is no history of palpitation or paroxysmal nocturnal dyspnea   patient was advised to follow low-salt low-cholesterol diet    ideally I want to keep systolic blood pressure below 14/08/2020 mmHg, patient was asked to check blood pressure one times a week and give me a report on that.  Patient will be follow-up in 3 months  or earlier as needed, patient will call me back for any change in the cardiovascular symptoms Patient was advised to buy a book from local bookstore concerning blood pressure and read several chapters  every day.  This will be supplemented by some of the material we will give him from the office.  Patient should also utilize other resources like YouTube and Internet to learn more about the blood pressure and the diet.      Relevant Orders   CBC with Differential/Platelet   COMPLETE METABOLIC PANEL WITH GFR     Respiratory   Seasonal allergic rhinitis due to pollen    Take Claritin 5 mg p.o. daily        Other   Edema    Swelling of the both legs has decreased, but only 1+ pedal edema      Obesity (BMI 35.0-39.9 without comorbidity)    - I encouraged the patient to lose weight.  - I educated them on making healthy dietary choices including eating more fruits and vegetables and less fried foods. - I encouraged the patient to exercise more, and educated on the benefits of exercise including weight loss, diabetes prevention, and hypertension  prevention.   Dietary counseling with a registered dietician  Referral to a weight management support group (e.g. Weight Watchers, Overeaters Anonymous)  If your BMI  is greater than 29 or you have gained more than 15 pounds you should work on weight loss.  Attend a healthy cooking class       Relevant Orders   Lipid panel   TSH   Other Visit Diagnoses     Encounter for screening for HIV       Relevant Orders   HIV antibody (with reflex)   Need for hepatitis C screening test       Relevant Orders   Hepatitis C Antibody       No orders of the defined types were placed in this encounter.   Follow-up: No follow-ups on file.    Corky DownsJaved Cayetano Mikita, MD

## 2021-12-07 NOTE — Assessment & Plan Note (Signed)

## 2021-12-07 NOTE — Assessment & Plan Note (Signed)
Swelling of the both legs has decreased, but only 1+ pedal edema

## 2021-12-08 LAB — CBC WITH DIFFERENTIAL/PLATELET
Absolute Monocytes: 358 cells/uL (ref 200–950)
Basophils Absolute: 29 cells/uL (ref 0–200)
Basophils Relative: 0.6 %
Eosinophils Absolute: 59 cells/uL (ref 15–500)
Eosinophils Relative: 1.2 %
HCT: 45.9 % — ABNORMAL HIGH (ref 35.0–45.0)
Hemoglobin: 15.2 g/dL (ref 11.7–15.5)
Lymphs Abs: 1872 cells/uL (ref 850–3900)
MCH: 31.3 pg (ref 27.0–33.0)
MCHC: 33.1 g/dL (ref 32.0–36.0)
MCV: 94.6 fL (ref 80.0–100.0)
MPV: 10.8 fL (ref 7.5–12.5)
Monocytes Relative: 7.3 %
Neutro Abs: 2582 cells/uL (ref 1500–7800)
Neutrophils Relative %: 52.7 %
Platelets: 292 10*3/uL (ref 140–400)
RBC: 4.85 10*6/uL (ref 3.80–5.10)
RDW: 11.9 % (ref 11.0–15.0)
Total Lymphocyte: 38.2 %
WBC: 4.9 10*3/uL (ref 3.8–10.8)

## 2021-12-08 LAB — COMPLETE METABOLIC PANEL WITH GFR
AG Ratio: 1.4 (calc) (ref 1.0–2.5)
ALT: 29 U/L (ref 6–29)
AST: 16 U/L (ref 10–35)
Albumin: 4.2 g/dL (ref 3.6–5.1)
Alkaline phosphatase (APISO): 68 U/L (ref 37–153)
BUN: 15 mg/dL (ref 7–25)
CO2: 28 mmol/L (ref 20–32)
Calcium: 9.4 mg/dL (ref 8.6–10.4)
Chloride: 103 mmol/L (ref 98–110)
Creat: 0.74 mg/dL (ref 0.50–1.03)
Globulin: 3 g/dL (calc) (ref 1.9–3.7)
Glucose, Bld: 94 mg/dL (ref 65–99)
Potassium: 4.1 mmol/L (ref 3.5–5.3)
Sodium: 139 mmol/L (ref 135–146)
Total Bilirubin: 0.4 mg/dL (ref 0.2–1.2)
Total Protein: 7.2 g/dL (ref 6.1–8.1)
eGFR: 97 mL/min/{1.73_m2} (ref >=60)

## 2021-12-08 LAB — HEPATITIS C ANTIBODY
Hepatitis C Ab: NONREACTIVE
SIGNAL TO CUT-OFF: 0.09 (ref <1.00)

## 2021-12-08 LAB — LIPID PANEL
Cholesterol: 172 mg/dL (ref <200)
HDL: 45 mg/dL — ABNORMAL LOW (ref >=50)
LDL Cholesterol (Calc): 105 mg/dL (calc) — ABNORMAL HIGH
Non-HDL Cholesterol (Calc): 127 mg/dL (calc) (ref <130)
Total CHOL/HDL Ratio: 3.8 (calc) (ref <5.0)
Triglycerides: 121 mg/dL (ref <150)

## 2021-12-08 LAB — HIV ANTIBODY (ROUTINE TESTING W REFLEX): HIV 1&2 Ab, 4th Generation: NONREACTIVE

## 2021-12-08 LAB — TSH: TSH: 0.5 mIU/L

## 2022-04-05 ENCOUNTER — Encounter: Payer: Self-pay | Admitting: Internal Medicine

## 2022-04-05 ENCOUNTER — Ambulatory Visit (INDEPENDENT_AMBULATORY_CARE_PROVIDER_SITE_OTHER): Payer: Managed Care, Other (non HMO) | Admitting: Internal Medicine

## 2022-04-05 VITALS — BP 131/82 | HR 84 | Ht 63.0 in | Wt 212.0 lb

## 2022-04-05 DIAGNOSIS — R609 Edema, unspecified: Secondary | ICD-10-CM | POA: Diagnosis not present

## 2022-04-05 DIAGNOSIS — J301 Allergic rhinitis due to pollen: Secondary | ICD-10-CM | POA: Diagnosis not present

## 2022-04-05 DIAGNOSIS — E669 Obesity, unspecified: Secondary | ICD-10-CM

## 2022-04-05 DIAGNOSIS — I10 Essential (primary) hypertension: Secondary | ICD-10-CM | POA: Diagnosis not present

## 2022-04-05 MED ORDER — LOSARTAN POTASSIUM-HCTZ 50-12.5 MG PO TABS: 1.0000 | ORAL_TABLET | Freq: Every day | ORAL | 3 refills | Status: DC

## 2022-04-05 NOTE — Assessment & Plan Note (Signed)
Resolved

## 2022-04-05 NOTE — Assessment & Plan Note (Signed)
Take Claritin 5 mg p.o. daily 

## 2022-04-05 NOTE — Assessment & Plan Note (Signed)

## 2022-04-05 NOTE — Assessment & Plan Note (Signed)

## 2022-04-05 NOTE — Progress Notes (Signed)
Established Patient Office Visit  Subjective:  Patient ID: Sydney Nguyen, female    DOB: Mar 18, 1966  Age: 56 y.o. MRN: [id number]  CC:  Chief Complaint  Patient presents with   Follow-up    HPI  Sydney Nguyen presents for general check  History reviewed. No pertinent past medical history.  History reviewed. No pertinent surgical history.  Family History  Problem Relation Age of Onset   Breast cancer Neg Hx     Social History   Socioeconomic History   Marital status: Married    Spouse name: Not on file   Number of children: Not on file   Years of education: Not on file   Highest education level: Not on file  Occupational History   Not on file  Tobacco Use   Smoking status: Never   Smokeless tobacco: Never  Substance and Sexual Activity   Alcohol use: Never   Drug use: Never   Sexual activity: Yes  Other Topics Concern   Not on file  Social History Narrative   ** Merged History Encounter **       Social Determinants of Health   Financial Resource Strain: Not on file  Food Insecurity: Not on file  Transportation Needs: Not on file  Physical Activity: Not on file  Stress: Not on file  Social Connections: Not on file  Intimate Partner Violence: Not on file     Current Outpatient Medications:    losartan-hydrochlorothiazide (HYZAAR) 50-12.5 MG tablet, Take 1 tablet by mouth daily., Disp: 90 tablet, Rfl: 1   No Known Allergies  ROS Review of Systems  Constitutional: Negative.   HENT: Negative.    Eyes: Negative.   Respiratory: Negative.    Cardiovascular: Negative.   Gastrointestinal: Negative.   Endocrine: Negative.   Genitourinary: Negative.   Musculoskeletal: Negative.   Skin: Negative.   Allergic/Immunologic: Negative.   Neurological: Negative.   Hematological: Negative.   Psychiatric/Behavioral: Negative.    All other systems reviewed and are negative.    Objective:    Physical Exam Vitals reviewed.  Constitutional:       Appearance: Normal appearance. She is obese.  HENT:     Mouth/Throat:     Mouth: Mucous membranes are moist.  Eyes:     Pupils: Pupils are equal, round, and reactive to light.  Neck:     Vascular: No carotid bruit.  Cardiovascular:     Rate and Rhythm: Normal rate and regular rhythm.     Pulses: Normal pulses.     Heart sounds: Normal heart sounds.  Pulmonary:     Effort: Pulmonary effort is normal.     Breath sounds: Normal breath sounds.  Abdominal:     General: Bowel sounds are normal.     Palpations: Abdomen is soft. There is no hepatomegaly, splenomegaly or mass.     Tenderness: There is no abdominal tenderness.     Hernia: No hernia is present.  Musculoskeletal:        General: No tenderness.     Cervical back: Neck supple.     Right lower leg: No edema.     Left lower leg: No edema.  Skin:    Findings: No rash.  Neurological:     Mental Status: She is alert and oriented to person, place, and time.     Motor: No weakness.  Psychiatric:        Mood and Affect: Mood and affect normal.        Behavior:  Behavior normal.    BP 131/82   Pulse 84   Ht $R'5\' 3"'Cc$  (1.6 m)   Wt 212 lb (96.2 kg)   BMI 37.55 kg/m  Wt Readings from Last 3 Encounters:  04/05/22 212 lb (96.2 kg)  12/07/21 213 lb (96.6 kg)  05/23/21 212 lb 5.8 oz (96.3 kg)     Health Maintenance Due  Topic Date Due   COVID-19 Vaccine (1) Never done    There are no preventive care reminders to display for this patient.  Lab Results  Component Value Date   TSH 0.50 12/07/2021   Lab Results  Component Value Date   WBC 4.9 12/07/2021   HGB 15.2 12/07/2021   HCT 45.9 (H) 12/07/2021   MCV 94.6 12/07/2021   PLT 292 12/07/2021   Lab Results  Component Value Date   NA 139 12/07/2021   K 4.1 12/07/2021   CO2 28 12/07/2021   GLUCOSE 94 12/07/2021   BUN 15 12/07/2021   CREATININE 0.74 12/07/2021   BILITOT 0.4 12/07/2021   AST 16 12/07/2021   ALT 29 12/07/2021   PROT 7.2 12/07/2021   CALCIUM  9.4 12/07/2021   EGFR 97 12/07/2021   Lab Results  Component Value Date   CHOL 172 12/07/2021   Lab Results  Component Value Date   HDL 45 (L) 12/07/2021   Lab Results  Component Value Date   LDLCALC 105 (H) 12/07/2021   Lab Results  Component Value Date   TRIG 121 12/07/2021   Lab Results  Component Value Date   CHOLHDL 3.8 12/07/2021   No results found for: HGBA1C    Assessment & Plan:   Problem List Items Addressed This Visit       Cardiovascular and Mediastinum   Essential hypertension - Primary     Patient denies any chest pain or shortness of breath there is no history of palpitation or paroxysmal nocturnal dyspnea   patient was advised to follow low-salt low-cholesterol diet    ideally I want to keep systolic blood pressure below 130 mmHg, patient was asked to check blood pressure one times a week and give me a report on that.  Patient will be follow-up in 3 months  or earlier as needed, patient will call me back for any change in the cardiovascular symptoms Patient was advised to buy a book from local bookstore concerning blood pressure and read several chapters  every day.  This will be supplemented by some of the material we will give him from the office.  Patient should also utilize other resources like YouTube and Internet to learn more about the blood pressure and the diet.         Respiratory   Seasonal allergic rhinitis due to pollen    Take Claritin 5 mg p.o. daily         Other   Edema    Resolved       Obesity (BMI 35.0-39.9 without comorbidity)    - I encouraged the patient to lose weight.  - I educated them on making healthy dietary choices including eating more fruits and vegetables and less fried foods. - I encouraged the patient to exercise more, and educated on the benefits of exercise including weight loss, diabetes prevention, and hypertension prevention.   Dietary counseling with a registered dietician  Referral to a weight  management support group (e.g. Weight Watchers, Overeaters Anonymous)  If your BMI is greater than 29 or you have gained more than 15 pounds  you should work on weight loss.  Attend a healthy cooking class         No orders of the defined types were placed in this encounter.   Follow-up: No follow-ups on file.    Cletis Athens, MD

## 2022-07-18 ENCOUNTER — Ambulatory Visit (INDEPENDENT_AMBULATORY_CARE_PROVIDER_SITE_OTHER): Payer: Managed Care, Other (non HMO) | Admitting: Internal Medicine

## 2022-07-18 ENCOUNTER — Encounter: Payer: Self-pay | Admitting: Internal Medicine

## 2022-07-18 VITALS — BP 130/90 | HR 84 | Ht 63.0 in | Wt 215.9 lb

## 2022-07-18 DIAGNOSIS — G8929 Other chronic pain: Secondary | ICD-10-CM | POA: Insufficient documentation

## 2022-07-18 DIAGNOSIS — J301 Allergic rhinitis due to pollen: Secondary | ICD-10-CM | POA: Diagnosis not present

## 2022-07-18 DIAGNOSIS — M25562 Pain in left knee: Secondary | ICD-10-CM

## 2022-07-18 DIAGNOSIS — I10 Essential (primary) hypertension: Secondary | ICD-10-CM | POA: Diagnosis not present

## 2022-07-18 DIAGNOSIS — M25561 Pain in right knee: Secondary | ICD-10-CM | POA: Diagnosis not present

## 2022-07-18 DIAGNOSIS — E669 Obesity, unspecified: Secondary | ICD-10-CM

## 2022-07-18 NOTE — Assessment & Plan Note (Signed)
Take Claritin 5 mg p.o. daily 

## 2022-07-18 NOTE — Progress Notes (Signed)
Established Patient Office Visit  Subjective:  Patient ID: Kiyonna Tortorelli, female    DOB: [birth year]  Age: 56 y.o. MRN: [id number]  CC:  Chief Complaint  Patient presents with   Hypertension    Hypertension    Verna Czech presents for pain in rt and lt knee, h/o injury  rt knee  No past medical history on file.  No past surgical history on file.  Family History  Problem Relation Age of Onset   Breast cancer Neg Hx     Social History   Socioeconomic History   Marital status: Married    Spouse name: Not on file   Number of children: Not on file   Years of education: Not on file   Highest education level: Not on file  Occupational History   Not on file  Tobacco Use   Smoking status: Never   Smokeless tobacco: Never  Substance and Sexual Activity   Alcohol use: Never   Drug use: Never   Sexual activity: Yes  Other Topics Concern   Not on file  Social History Narrative   ** Merged History Encounter **       Social Determinants of Health   Financial Resource Strain: Not on file  Food Insecurity: Not on file  Transportation Needs: Not on file  Physical Activity: Not on file  Stress: Not on file  Social Connections: Not on file  Intimate Partner Violence: Not on file     Current Outpatient Medications:    losartan-hydrochlorothiazide (HYZAAR) 50-12.5 MG tablet, Take 1 tablet by mouth daily., Disp: 90 tablet, Rfl: 3   No Known Allergies  ROS Review of Systems  Constitutional: Negative.   HENT: Negative.    Eyes: Negative.   Respiratory: Negative.    Cardiovascular: Negative.   Gastrointestinal: Negative.   Endocrine: Negative.   Genitourinary: Negative.   Musculoskeletal: Negative.        Both knee pain  Skin: Negative.   Allergic/Immunologic: Negative.   Neurological: Negative.   Hematological: Negative.   Psychiatric/Behavioral: Negative.    All other systems reviewed and are negative.     Objective:    Physical  Exam Vitals reviewed.  Constitutional:      Appearance: Normal appearance.  HENT:     Mouth/Throat:     Mouth: Mucous membranes are moist.  Eyes:     Pupils: Pupils are equal, round, and reactive to light.  Neck:     Vascular: No carotid bruit.  Cardiovascular:     Rate and Rhythm: Normal rate and regular rhythm.     Pulses: Normal pulses.     Heart sounds: Normal heart sounds.  Pulmonary:     Effort: Pulmonary effort is normal.     Breath sounds: Normal breath sounds.  Abdominal:     General: Bowel sounds are normal.     Palpations: Abdomen is soft. There is no hepatomegaly, splenomegaly or mass.     Tenderness: There is no abdominal tenderness.     Hernia: No hernia is present.  Musculoskeletal:        General: No tenderness.     Cervical back: Neck supple.     Right lower leg: No edema.     Left lower leg: No edema.     Comments: Both knee pain  Skin:    Findings: No rash.  Neurological:     Mental Status: She is alert and oriented to person, place, and time.     Motor: No  weakness.  Psychiatric:        Mood and Affect: Mood and affect normal.        Behavior: Behavior normal.     BP (!) 130/90 Comment: patient having knee pain  Pulse 84   Ht $R'5\' 3"'Tv$  (1.6 m)   Wt 215 lb 14.4 oz (97.9 kg)   BMI 38.24 kg/m  Wt Readings from Last 3 Encounters:  07/18/22 215 lb 14.4 oz (97.9 kg)  04/05/22 212 lb (96.2 kg)  12/07/21 213 lb (96.6 kg)     Health Maintenance Due  Topic Date Due   COVID-19 Vaccine (1) Never done   PAP SMEAR-Modifier  Never done   Zoster Vaccines- Shingrix (1 of 2) Never done   INFLUENZA VACCINE  Never done    There are no preventive care reminders to display for this patient.  Lab Results  Component Value Date   TSH 0.50 12/07/2021   Lab Results  Component Value Date   WBC 4.9 12/07/2021   HGB 15.2 12/07/2021   HCT 45.9 (H) 12/07/2021   MCV 94.6 12/07/2021   PLT 292 12/07/2021   Lab Results  Component Value Date   NA 139 12/07/2021    K 4.1 12/07/2021   CO2 28 12/07/2021   GLUCOSE 94 12/07/2021   BUN 15 12/07/2021   CREATININE 0.74 12/07/2021   BILITOT 0.4 12/07/2021   AST 16 12/07/2021   ALT 29 12/07/2021   PROT 7.2 12/07/2021   CALCIUM 9.4 12/07/2021   EGFR 97 12/07/2021   Lab Results  Component Value Date   CHOL 172 12/07/2021   Lab Results  Component Value Date   HDL 45 (L) 12/07/2021   Lab Results  Component Value Date   LDLCALC 105 (H) 12/07/2021   Lab Results  Component Value Date   TRIG 121 12/07/2021   Lab Results  Component Value Date   CHOLHDL 3.8 12/07/2021   No results found for: "HGBA1C"    Assessment & Plan:   Problem List Items Addressed This Visit       Cardiovascular and Mediastinum   Essential hypertension     Patient denies any chest pain or shortness of breath there is no history of palpitation or paroxysmal nocturnal dyspnea   patient was advised to follow low-salt low-cholesterol diet    ideally I want to keep systolic blood pressure below 130 mmHg, patient was asked to check blood pressure one times a week and give me a report on that.  Patient will be follow-up in 3 months  or earlier as needed, patient will call me back for any change in the cardiovascular symptoms Patient was advised to buy a book from local bookstore concerning blood pressure and read several chapters  every day.  This will be supplemented by some of the material we will give him from the office.  Patient should also utilize other resources like YouTube and Internet to learn more about the blood pressure and the diet.        Respiratory   Seasonal allergic rhinitis due to pollen    Take Claritin 5 mg p.o. daily        Other   Obesity (BMI 35.0-39.9 without comorbidity)    - I encouraged the patient to lose weight.  - I educated them on making healthy dietary choices including eating more fruits and vegetables and less fried foods. - I encouraged the patient to exercise more, and educated on  the benefits of exercise including weight loss,  diabetes prevention, and hypertension prevention.   Dietary counseling with a registered dietician  Referral to a weight management support group (e.g. Weight Watchers, Overeaters Anonymous)  If your BMI is greater than 29 or you have gained more than 15 pounds you should work on weight loss.  Attend a healthy cooking class       Chronic pain of both knees - Primary    Referred to orthopedic surgeon       No orders of the defined types were placed in this encounter.   Follow-up: No follow-ups on file.    Cletis Athens, MD

## 2022-07-18 NOTE — Assessment & Plan Note (Signed)

## 2022-07-18 NOTE — Assessment & Plan Note (Signed)

## 2022-07-18 NOTE — Assessment & Plan Note (Signed)
Referred to orthopedic surgeon. 

## 2022-10-03 ENCOUNTER — Ambulatory Visit: Payer: Managed Care, Other (non HMO) | Admitting: Internal Medicine

## 2022-10-16 ENCOUNTER — Ambulatory Visit (INDEPENDENT_AMBULATORY_CARE_PROVIDER_SITE_OTHER): Payer: Managed Care, Other (non HMO) | Admitting: Internal Medicine

## 2022-10-16 ENCOUNTER — Encounter: Payer: Self-pay | Admitting: Internal Medicine

## 2022-10-16 VITALS — BP 134/82 | HR 67 | Temp 97.8°F | Ht 63.0 in | Wt 205.5 lb

## 2022-10-16 DIAGNOSIS — E669 Obesity, unspecified: Secondary | ICD-10-CM

## 2022-10-16 DIAGNOSIS — J301 Allergic rhinitis due to pollen: Secondary | ICD-10-CM

## 2022-10-16 DIAGNOSIS — I10 Essential (primary) hypertension: Secondary | ICD-10-CM | POA: Diagnosis not present

## 2022-10-16 DIAGNOSIS — R609 Edema, unspecified: Secondary | ICD-10-CM | POA: Diagnosis not present

## 2022-10-16 NOTE — Progress Notes (Signed)
Established Patient Office Visit  Subjective:  Patient ID: Sydney Nguyen, female    DOB: December 08, 1965  Age: 56 y.o. MRN: [id number]  CC:  Chief Complaint  Patient presents with   Hypertension    HPI  Sydney Nguyen presents forcheck up  History reviewed. No pertinent past medical history.  History reviewed. No pertinent surgical history.  Family History  Problem Relation Age of Onset   Breast cancer Neg Hx     Social History   Socioeconomic History   Marital status: Married    Spouse name: Not on file   Number of children: Not on file   Years of education: Not on file   Highest education level: Not on file  Occupational History   Not on file  Tobacco Use   Smoking status: Never   Smokeless tobacco: Never  Substance and Sexual Activity   Alcohol use: Never   Drug use: Never   Sexual activity: Yes  Other Topics Concern   Not on file  Social History Narrative   ** Merged History Encounter **       Social Determinants of Health   Financial Resource Strain: Not on file  Food Insecurity: Not on file  Transportation Needs: Not on file  Physical Activity: Not on file  Stress: Not on file  Social Connections: Not on file  Intimate Partner Violence: Not on file     Current Outpatient Medications:    losartan-hydrochlorothiazide (HYZAAR) 50-12.5 MG tablet, Take 1 tablet by mouth daily., Disp: 90 tablet, Rfl: 3   omeprazole (PRILOSEC) 20 MG capsule, Take 20 mg by mouth daily., Disp: , Rfl:    No Known Allergies  ROS Review of Systems  Constitutional: Negative.   HENT: Negative.    Eyes: Negative.   Respiratory: Negative.    Cardiovascular: Negative.   Gastrointestinal: Negative.   Endocrine: Negative.   Genitourinary: Negative.   Musculoskeletal: Negative.   Skin: Negative.   Allergic/Immunologic: Negative.   Neurological: Negative.   Hematological: Negative.   Psychiatric/Behavioral: Negative.    All other systems reviewed and are  negative.     Objective:    Physical Exam Vitals reviewed.  Constitutional:      Appearance: Normal appearance.  HENT:     Mouth/Throat:     Mouth: Mucous membranes are moist.  Eyes:     Pupils: Pupils are equal, round, and reactive to light.  Neck:     Vascular: No carotid bruit.  Cardiovascular:     Rate and Rhythm: Normal rate and regular rhythm.     Pulses: Normal pulses.     Heart sounds: Normal heart sounds.  Pulmonary:     Effort: Pulmonary effort is normal.     Breath sounds: Normal breath sounds.  Abdominal:     General: Bowel sounds are normal.     Palpations: Abdomen is soft. There is no hepatomegaly, splenomegaly or mass.     Tenderness: There is no abdominal tenderness.     Hernia: No hernia is present.  Musculoskeletal:        General: No tenderness.     Cervical back: Neck supple.     Right lower leg: No edema.     Left lower leg: No edema.  Skin:    Findings: No rash.  Neurological:     Mental Status: She is alert and oriented to person, place, and time.     Motor: No weakness.  Psychiatric:        Mood  and Affect: Mood and affect normal.        Behavior: Behavior normal.     BP 134/82   Pulse 67   Temp 97.8 F (36.6 C) (Temporal)   Ht _0  (1.6 m)   Wt 205 lb 8 oz (93.2 kg)   SpO2 97%   BMI 36.40 kg/m  Wt Readings from Last 3 Encounters:  10/16/22 205 lb 8 oz (93.2 kg)  07/18/22 215 lb 14.4 oz (97.9 kg)  04/05/22 212 lb (96.2 kg)     There are no preventive care reminders to display for this patient.  There are no preventive care reminders to display for this patient.  Lab Results  Component Value Date   TSH 0.50 12/07/2021   Lab Results  Component Value Date   WBC 4.9 12/07/2021   HGB 15.2 12/07/2021   HCT 45.9 (H) 12/07/2021   MCV 94.6 12/07/2021   PLT 292 12/07/2021   Lab Results  Component Value Date   NA 139 12/07/2021   K 4.1 12/07/2021   CO2 28 12/07/2021   GLUCOSE 94 12/07/2021   BUN 15 12/07/2021    CREATININE 0.74 12/07/2021   BILITOT 0.4 12/07/2021   AST 16 12/07/2021   ALT 29 12/07/2021   PROT 7.2 12/07/2021   CALCIUM 9.4 12/07/2021   EGFR 97 12/07/2021   Lab Results  Component Value Date   CHOL 172 12/07/2021   Lab Results  Component Value Date   HDL 45 (L) 12/07/2021   Lab Results  Component Value Date   LDLCALC 105 (H) 12/07/2021   Lab Results  Component Value Date   TRIG 121 12/07/2021   Lab Results  Component Value Date   CHOLHDL 3.8 12/07/2021   No results found for: "HGBA1C"    Assessment & Plan:   Problem List Items Addressed This Visit       Cardiovascular and Mediastinum   Essential hypertension - Primary     Patient denies any chest pain or shortness of breath there is no history of palpitation or paroxysmal nocturnal dyspnea   patient was advised to follow low-salt low-cholesterol diet    ideally I want to keep systolic blood pressure below 130 mmHg, patient was asked to check blood pressure one times a week and give me a report on that.  Patient will be follow-up in 3 months  or earlier as needed, patient will call me back for any change in the cardiovascular symptoms Patient was advised to buy a book from local bookstore concerning blood pressure and read several chapters  every day.  This will be supplemented by some of the material we will give him from the office.  Patient should also utilize other resources like YouTube and Internet to learn more about the blood pressure and the diet.        Respiratory   Seasonal allergic rhinitis due to pollen    Take Claritin 5 mg p.o. daily as needed        Other   Edema    Patient has lost more than 10 pounds on vegan diet      Obesity (BMI 35.0-39.9 without comorbidity)    No orders of the defined types were placed in this encounter.   Follow-up: No follow-ups on file.    Cletis Athens, MD

## 2022-10-16 NOTE — Assessment & Plan Note (Signed)
Take Claritin 5 mg p.o. daily as needed 

## 2022-10-16 NOTE — Assessment & Plan Note (Signed)
Patient has lost more than 10 pounds on vegan diet

## 2022-10-16 NOTE — Assessment & Plan Note (Signed)

## 2023-06-14 IMAGING — MG DIGITAL DIAGNOSTIC BILAT W/ TOMO W/ CAD
6 of 10 series · 6 of 30 positions shown · non-contrast
Comparison: Previous exams.

CLINICAL DATA: Screening recall for right breast asymmetry and left
breast masses.



[L ML synth-2D]
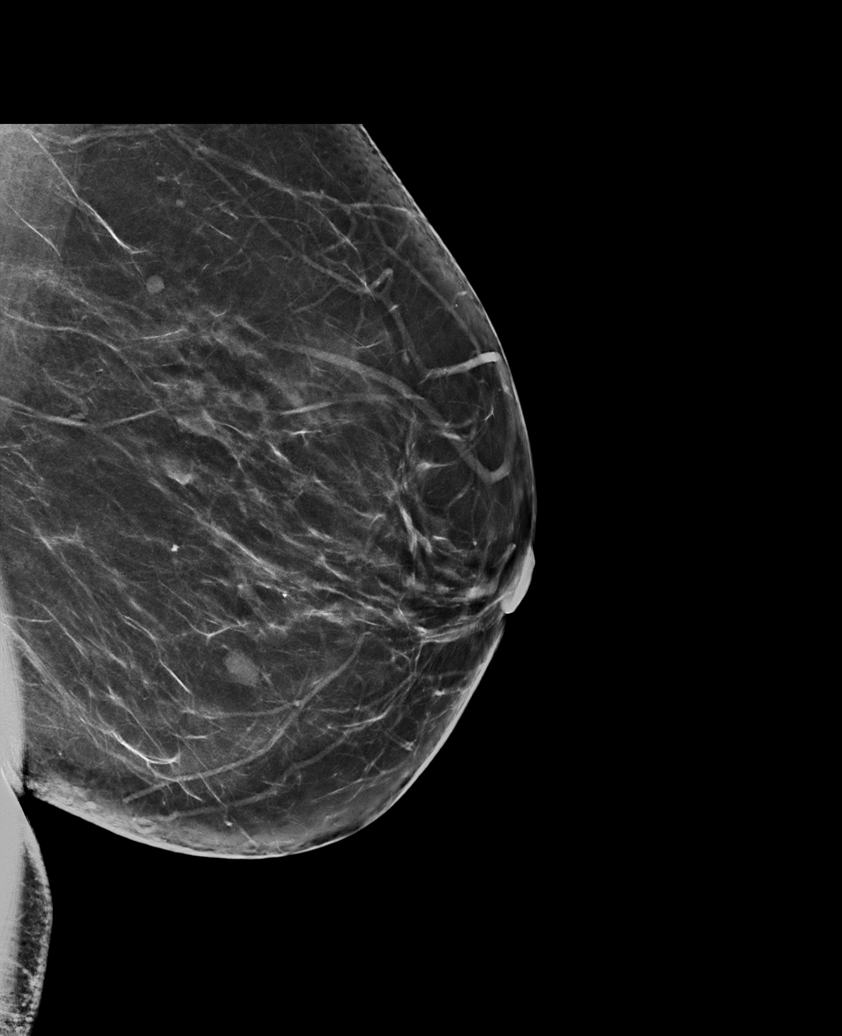

[R CC synth-2D]
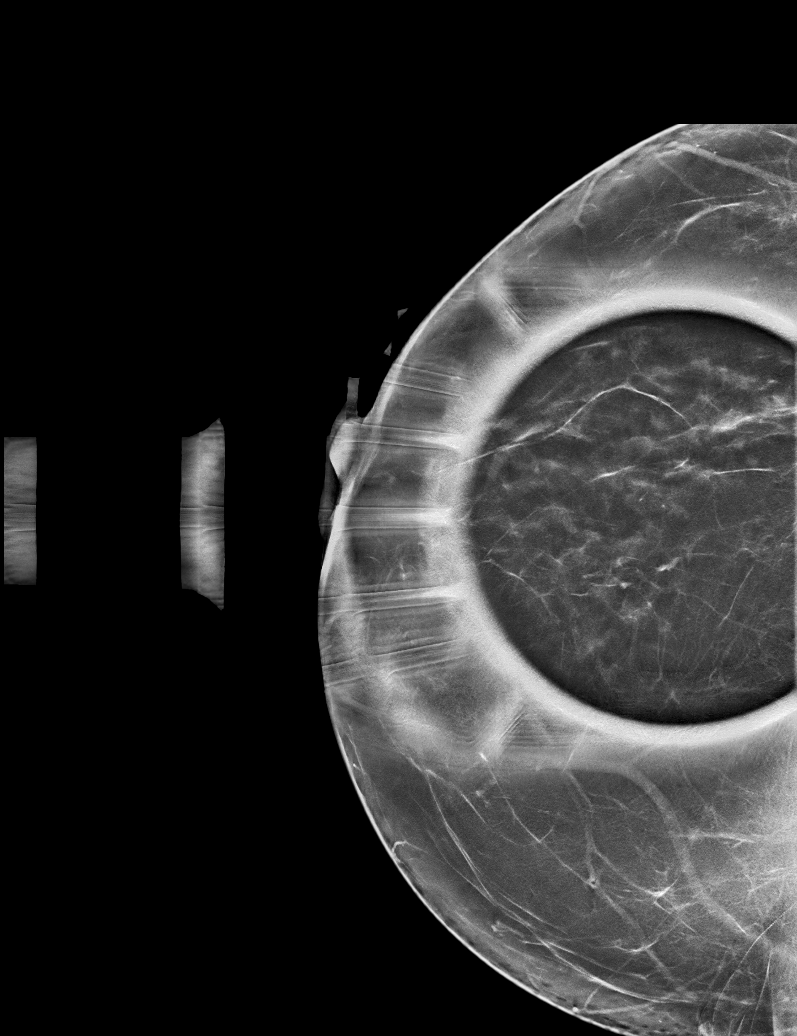

[L CC synth-2D]
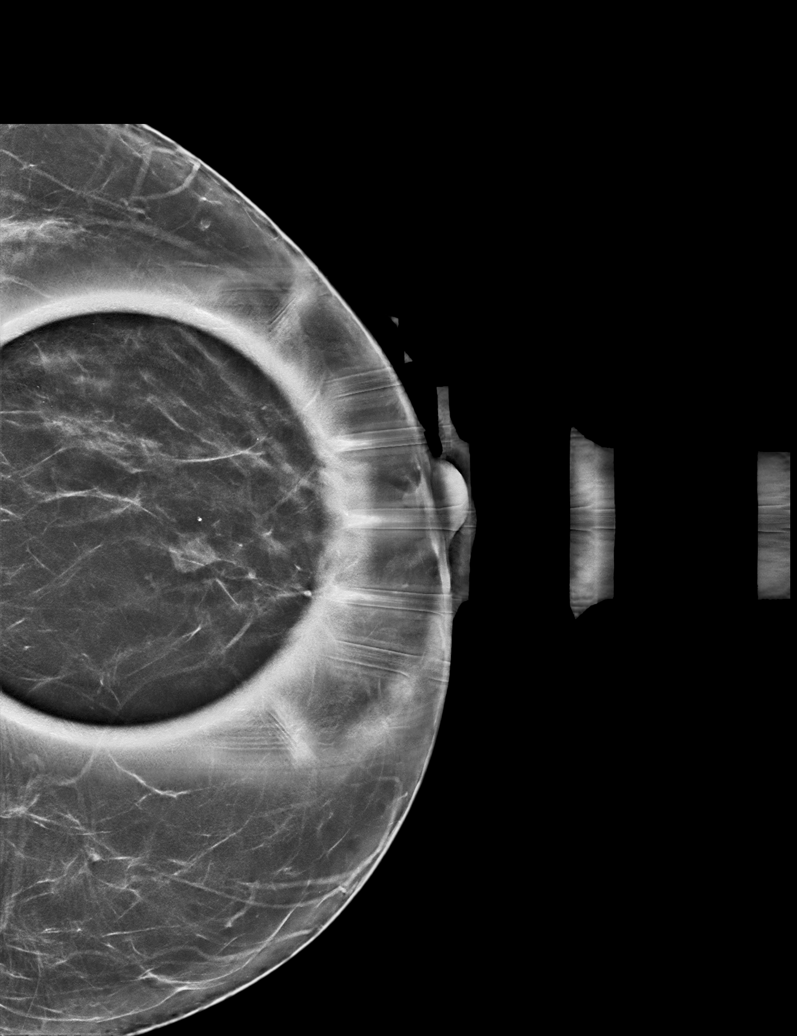

[L MLO synth-2D]
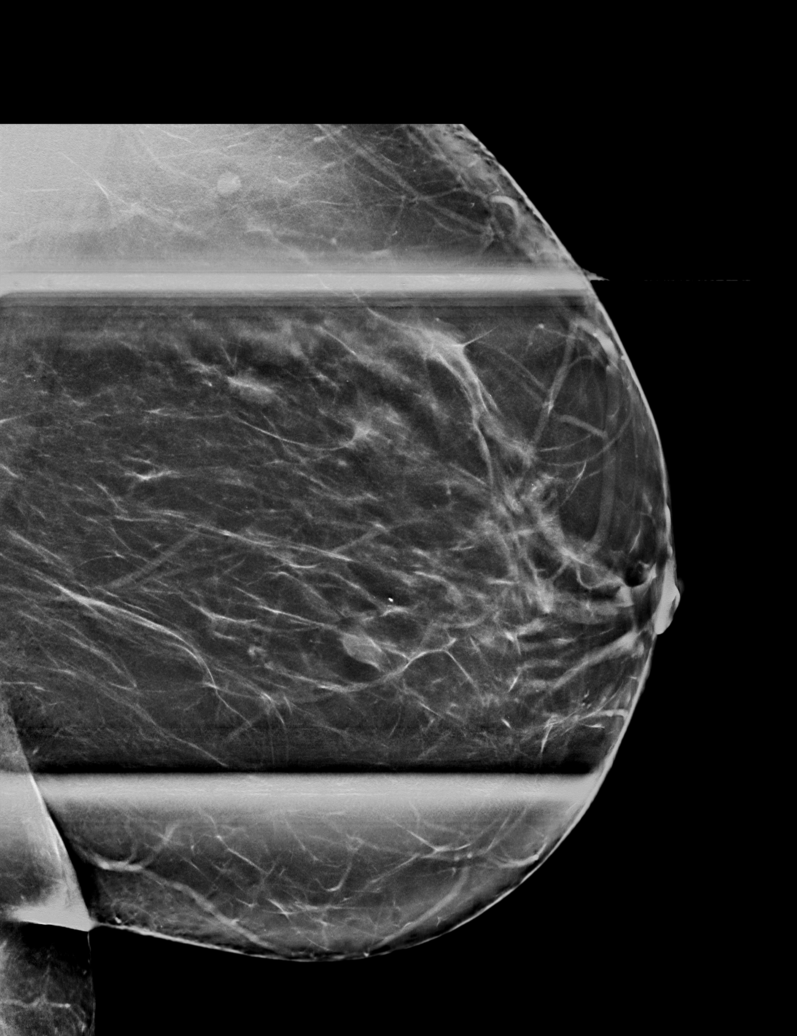

[R ML synth-2D]
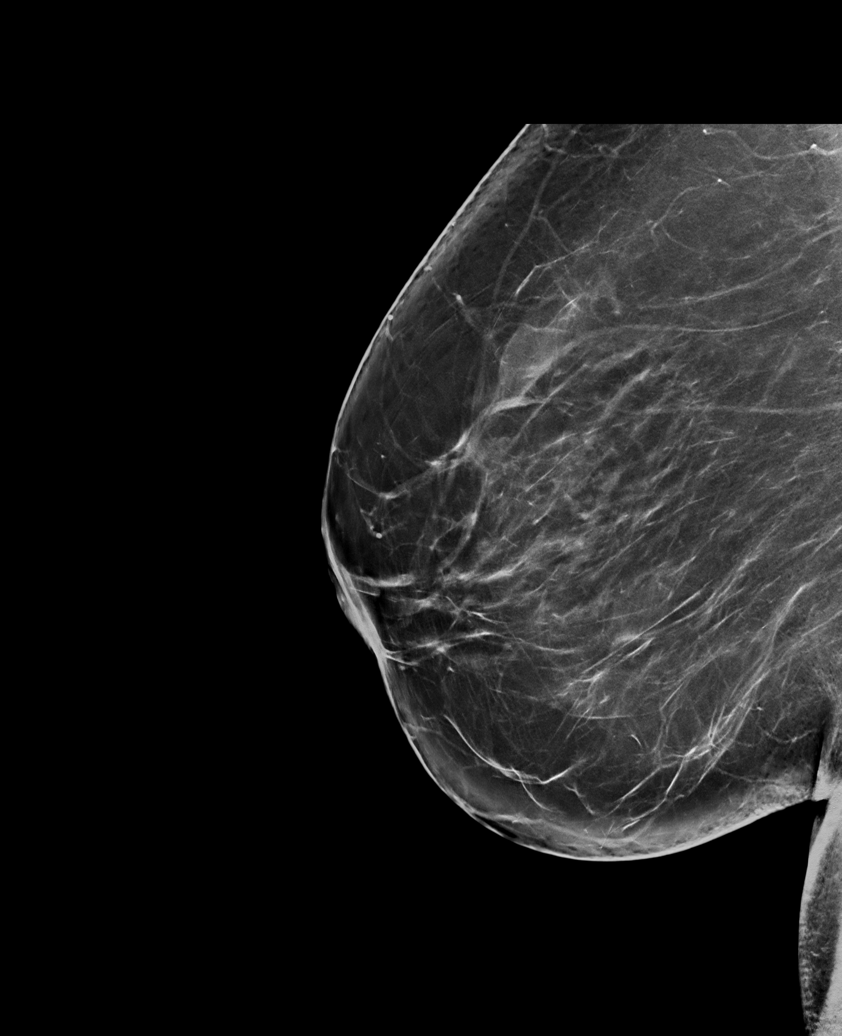

[R CC tomo · tomo slice 35/69.0]
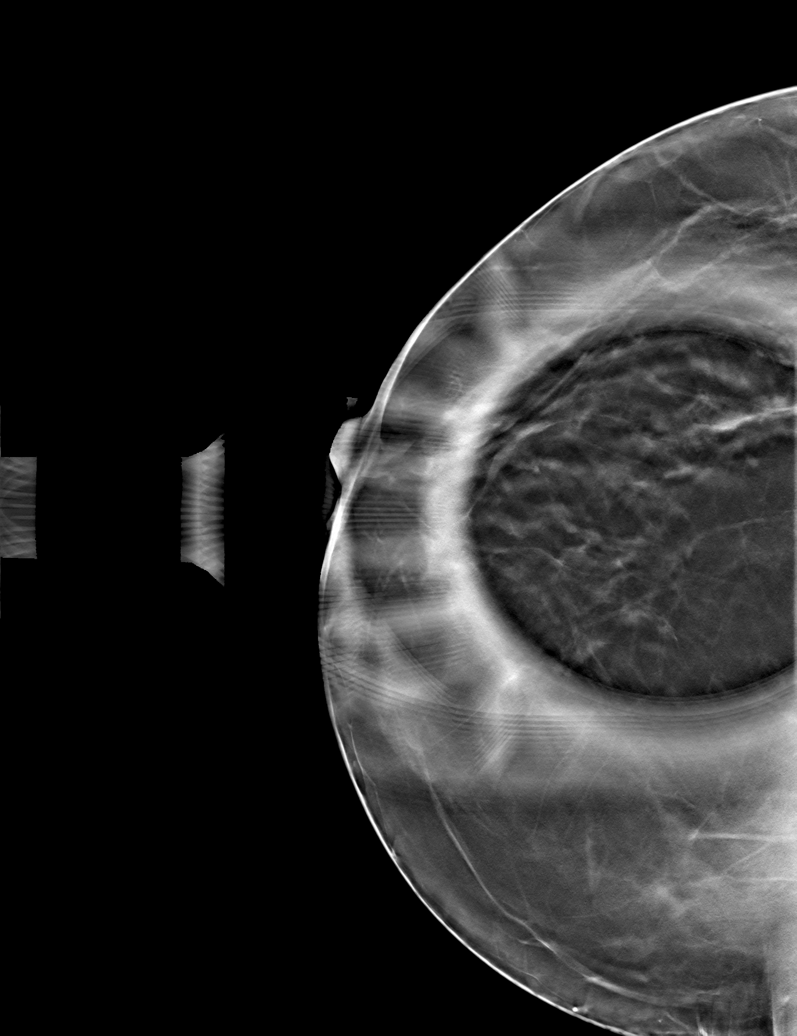

[6 of 30 positions shown; findings below may reference images not displayed]

ACR Breast Density Category b: There are scattered areas of
fibroglandular density.
FINDINGS: The initially questioned possible right breast asymmetry is felt to
resolve on the additional imaging with findings most suggestive of
an area of overlapping fibroglandular tissue. There is an oval mass
in the lower central left breast measuring approximately 0.9 cm.
There are 2 additional oval masses in the upper-outer left breast,
with the larger mass measuring approximately 1.2 cm in the smaller
mass measuring 0.8 cm.

Targeted ultrasound of the central to inner right breast was
performed. No suspicious masses or abnormality seen, only
normal-appearing fibroglandular tissue identified.

Targeted ultrasound of the left breast was performed. There is a
cluster of cyst at 6 o'clock 2 cm from nipple measuring 0.8 x 0.7 x
0.8 cm. This corresponds well with the mass seen in the lower
central left breast at mammography. Targeted ultrasound of the
upper-outer left breast was performed demonstrating several cysts
and clusters of cysts. A cyst at 2 o'clock 6 cm from nipple measures
0.6 x 0.3 x 0.8 cm an a cluster of cysts at the [DATE] position 7 cm
from nipple measures 0.9 x 0.6 x 0.5 cm. These correspond well with
the masses seen in the left breast at mammography.
IMPRESSION: No findings of malignancy in either breast.

RECOMMENDATION:
Screening mammogram in one year.(Code:FI-J-1BB)

I have discussed the findings and recommendations with the patient.
If applicable, a reminder letter will be sent to the patient
regarding the next appointment.

BI-RADS CATEGORY  2: Benign.

## 2024-01-25 ENCOUNTER — Other Ambulatory Visit: Payer: Self-pay | Admitting: Internal Medicine

## 2024-04-04 ENCOUNTER — Encounter: Payer: Self-pay | Admitting: Internal Medicine

## 2024-04-04 ENCOUNTER — Ambulatory Visit (INDEPENDENT_AMBULATORY_CARE_PROVIDER_SITE_OTHER): Payer: Self-pay | Admitting: Internal Medicine

## 2024-04-04 VITALS — BP 120/82 | HR 86 | Ht 63.0 in | Wt 219.2 lb

## 2024-04-04 DIAGNOSIS — M79672 Pain in left foot: Secondary | ICD-10-CM | POA: Diagnosis not present

## 2024-04-04 DIAGNOSIS — M542 Cervicalgia: Secondary | ICD-10-CM | POA: Diagnosis not present

## 2024-04-04 DIAGNOSIS — R7303 Prediabetes: Secondary | ICD-10-CM

## 2024-04-04 DIAGNOSIS — R5383 Other fatigue: Secondary | ICD-10-CM

## 2024-04-04 DIAGNOSIS — E782 Mixed hyperlipidemia: Secondary | ICD-10-CM | POA: Diagnosis not present

## 2024-04-04 DIAGNOSIS — K219 Gastro-esophageal reflux disease without esophagitis: Secondary | ICD-10-CM | POA: Insufficient documentation

## 2024-04-04 DIAGNOSIS — I10 Essential (primary) hypertension: Secondary | ICD-10-CM

## 2024-04-04 DIAGNOSIS — F411 Generalized anxiety disorder: Secondary | ICD-10-CM

## 2024-04-04 DIAGNOSIS — R635 Abnormal weight gain: Secondary | ICD-10-CM

## 2024-04-04 MED ORDER — DULOXETINE HCL 30 MG PO CPEP
30.0000 mg | ORAL_CAPSULE | Freq: Every day | ORAL | 2 refills | Status: DC
Start: 2024-04-04 — End: 2024-05-02

## 2024-04-04 MED ORDER — CELECOXIB 200 MG PO CAPS: 200.0000 mg | ORAL_CAPSULE | Freq: Every day | ORAL | 2 refills | Status: DC

## 2024-04-04 MED ORDER — BACLOFEN 10 MG PO TABS: 10.0000 mg | ORAL_TABLET | Freq: Every day | ORAL | 1 refills | Status: DC

## 2024-04-04 MED ORDER — PREDNISONE 20 MG PO TABS: 40.0000 mg | ORAL_TABLET | Freq: Every day | ORAL | 0 refills | Status: DC

## 2024-04-04 NOTE — Progress Notes (Signed)
 New Patient Office Visit  Subjective   Patient ID: Sydney Nguyen, female    DOB: 09/12/1966  Age: 58 y.o. MRN: [id number]  CC:  Chief Complaint  Patient presents with   Establish Care    NPE    HPI Sydney Nguyen presents to establish care Previous Primary Care provider/office:   she does not have additional concerns to discuss today.   Patient comes in to establish PMD.  She has history of longstanding GERD and hypertension.  She has been taking losartan  50/12.5 mg with good blood pressure control.  She is also on omeprazole for the last 5 years, did not try to stop it because she feels if she skips a day or 2 she gets severe heartburn.  She never had EGD or H. pylori testing before.  Patient has been reluctant to get colonoscopy and Cologuard in the past.  However will consider now.  She is overdue for a Pap smear and mammogram. Patient has history of left knee pain and was diagnosed with arthritis with an x-ray several years ago.  But now it is getting worse and has more difficulty with weightbearing.  She only takes OTC Tylenol arthritis with some relief.  She is also complaining of neck pain and muscular stiffness.  Sometimes feels and not at the base of her neck with tight muscles.  Patient is left-handed and is also experiencing pain in her left thumb.  Also mentions pain in left heel, causing difficulty with walking.  Will check x-ray of the C-spine and left heel.  Also will check her arthritis panel, along with other labs.  Patient went through menopause at age 67.  Had menopausal symptoms at that time but then they resolved.  However she is noticing that she is getting moody and more irritable these days, also gets anxious.  Her PHQ-9/GAD score is 3/4.  She is agreeable to starting a small dose of her medication to help her moods and anxiety.   Mentions that she still has an IUD in place since 2005.  Has been reluctant to get it removed however will set up an OB/GYN  referral for its removal. Patient will return fasting for labs.  At her follow-up she will get a CPE with Pap, breast exam, schedule mammogram and discuss colonoscopy versus Cologuard. For her arthritic aches and pains we will give a trial of prednisone burst, followed by Celebrex.  Also add baclofen at bedtime. Will add Cymbalta for her anxiety, will also help with aches and pains.    Outpatient Encounter Medications as of 04/04/2024  Medication Sig   baclofen (LIORESAL) 10 MG tablet Take 1 tablet (10 mg total) by mouth daily.   celecoxib (CELEBREX) 200 MG capsule Take 1 capsule (200 mg total) by mouth daily.   DULoxetine (CYMBALTA) 30 MG capsule Take 1 capsule (30 mg total) by mouth daily.   losartan -hydrochlorothiazide  (HYZAAR) 50-12.5 MG tablet Take 1 tablet by mouth daily.   omeprazole (PRILOSEC) 20 MG capsule Take 20 mg by mouth daily.   predniSONE (DELTASONE) 20 MG tablet Take 2 tablets (40 mg total) by mouth daily with breakfast.   No facility-administered encounter medications on file as of 04/04/2024.    History reviewed. No pertinent past medical history.  History reviewed. No pertinent surgical history.  Family History  Problem Relation Age of Onset   Breast cancer Neg Hx     Social History   Socioeconomic History   Marital status: Married    Spouse  name: Not on file   Number of children: Not on file   Years of education: Not on file   Highest education level: Not on file  Occupational History   Not on file  Tobacco Use   Smoking status: Never   Smokeless tobacco: Never  Substance and Sexual Activity   Alcohol use: Never   Drug use: Never   Sexual activity: Yes  Other Topics Concern   Not on file  Social History Narrative   ** Merged History Encounter **       Social Drivers of Corporate investment banker Strain: Not on file  Food Insecurity: Not on file  Transportation Needs: Not on file  Physical Activity: Not on file  Stress: Not on file  Social  Connections: Not on file  Intimate Partner Violence: Not on file    Review of Systems  Constitutional: Negative.  Negative for chills, fever, malaise/fatigue and weight loss.  HENT: Negative.  Negative for congestion and hearing loss.   Eyes: Negative.   Respiratory: Negative.  Negative for cough and shortness of breath.   Cardiovascular: Negative.  Negative for chest pain, palpitations and leg swelling.  Gastrointestinal: Negative.  Negative for abdominal pain, constipation, diarrhea, heartburn, nausea and vomiting.  Genitourinary: Negative.  Negative for dysuria and flank pain.  Musculoskeletal:  Positive for joint pain (left knee joint) and neck pain. Negative for myalgias.  Skin: Negative.   Neurological:  Negative for dizziness, tingling, tremors, sensory change, speech change and headaches.  Endo/Heme/Allergies: Negative.   Psychiatric/Behavioral: Negative.  Negative for depression and suicidal ideas. The patient is not nervous/anxious.         Objective   BP 120/82   Pulse 86   Ht 5\' 3"  (1.6 m)   Wt 219 lb 3.2 oz (99.4 kg)   SpO2 97%   BMI 38.83 kg/m   Physical Exam Vitals and nursing note reviewed.  Constitutional:      Appearance: Normal appearance.  HENT:     Head: Normocephalic and atraumatic.     Nose: Nose normal.     Mouth/Throat:     Mouth: Mucous membranes are moist.     Pharynx: Oropharynx is clear.  Eyes:     Conjunctiva/sclera: Conjunctivae normal.     Pupils: Pupils are equal, round, and reactive to light.  Cardiovascular:     Rate and Rhythm: Normal rate and regular rhythm.     Pulses: Normal pulses.     Heart sounds: Normal heart sounds. No murmur heard. Pulmonary:     Effort: Pulmonary effort is normal.     Breath sounds: Normal breath sounds. No wheezing.  Abdominal:     General: Bowel sounds are normal.     Palpations: Abdomen is soft.     Tenderness: There is no abdominal tenderness. There is no right CVA tenderness or left CVA  tenderness.  Musculoskeletal:        General: Normal range of motion.     Cervical back: Normal range of motion.     Right lower leg: No edema.     Left lower leg: No edema.  Skin:    General: Skin is warm and dry.  Neurological:     General: No focal deficit present.     Mental Status: She is alert and oriented to person, place, and time.  Psychiatric:        Mood and Affect: Mood normal.        Behavior: Behavior normal.  Assessment & Plan:  Check x-rays, labs, start medication.  Patient will return for a CPE. Problem List Items Addressed This Visit     Essential hypertension, benign - Primary   Relevant Orders   CMP14+EGFR   Mixed hyperlipidemia   Relevant Orders   Lipid Panel w/o Chol/HDL Ratio   Prediabetes   Relevant Orders   Hemoglobin A1c   Gastroesophageal reflux disease without esophagitis   Relevant Orders   H. pylori breath test   CBC with Diff   Weight gain   Relevant Orders   Vitamin D (25 hydroxy)   Other Visit Diagnoses       Pain of left heel       Relevant Medications   predniSONE (DELTASONE) 20 MG tablet   celecoxib (CELEBREX) 200 MG capsule   Other Relevant Orders   XR Os Calcis Left     Neck pain       Relevant Medications   predniSONE (DELTASONE) 20 MG tablet   baclofen (LIORESAL) 10 MG tablet   celecoxib (CELEBREX) 200 MG capsule   Other Relevant Orders   DG Cervical Spine Complete     GAD (generalized anxiety disorder)       Relevant Medications   DULoxetine (CYMBALTA) 30 MG capsule   Other Relevant Orders   Vitamin D (25 hydroxy)     Other fatigue       Relevant Orders   Vitamin D (25 hydroxy)   TSH       Return in about 2 weeks (around 04/18/2024).   Total time spent: 30 minutes  Aisha Hove, MD  04/04/2024   This document may have been prepared by [facility] Voice Recognition software and as such may include unintentional dictation errors.

## 2024-04-08 ENCOUNTER — Ambulatory Visit (INDEPENDENT_AMBULATORY_CARE_PROVIDER_SITE_OTHER)

## 2024-04-08 ENCOUNTER — Other Ambulatory Visit

## 2024-04-08 ENCOUNTER — Ambulatory Visit: Payer: Self-pay | Admitting: Internal Medicine

## 2024-04-08 ENCOUNTER — Ambulatory Visit

## 2024-04-08 DIAGNOSIS — M542 Cervicalgia: Secondary | ICD-10-CM | POA: Diagnosis not present

## 2024-04-08 DIAGNOSIS — M79672 Pain in left foot: Secondary | ICD-10-CM | POA: Diagnosis not present

## 2024-04-09 LAB — HEMOGLOBIN A1C
Est. average glucose Bld gHb Est-mCnc: 123 mg/dL
Hgb A1c MFr Bld: 5.9 % — ABNORMAL HIGH (ref 4.8–5.6)

## 2024-04-09 LAB — CMP14+EGFR
ALT: 52 IU/L — ABNORMAL HIGH (ref 0–32)
AST: 29 IU/L (ref 0–40)
Albumin: 4.4 g/dL (ref 3.8–4.9)
Alkaline Phosphatase: 85 IU/L (ref 44–121)
BUN/Creatinine Ratio: 26 — ABNORMAL HIGH (ref 9–23)
BUN: 23 mg/dL (ref 6–24)
Bilirubin Total: 0.3 mg/dL (ref 0.0–1.2)
CO2: 22 mmol/L (ref 20–29)
Calcium: 9.5 mg/dL (ref 8.7–10.2)
Chloride: 102 mmol/L (ref 96–106)
Creatinine, Ser: 0.87 mg/dL (ref 0.57–1.00)
Globulin, Total: 2.5 g/dL (ref 1.5–4.5)
Glucose: 91 mg/dL (ref 70–99)
Potassium: 3.8 mmol/L (ref 3.5–5.2)
Sodium: 141 mmol/L (ref 134–144)
Total Protein: 6.9 g/dL (ref 6.0–8.5)
eGFR: 79 mL/min/{1.73_m2} (ref >59)

## 2024-04-09 LAB — TSH: TSH: 0.688 u[IU]/mL (ref 0.450–4.500)

## 2024-04-09 LAB — CBC WITH DIFFERENTIAL/PLATELET
Basophils Absolute: 0 10*3/uL (ref 0.0–0.2)
Basos: 0 %
EOS (ABSOLUTE): 0 10*3/uL (ref 0.0–0.4)
Eos: 0 %
Hematocrit: 43.4 % (ref 34.0–46.6)
Hemoglobin: 14.3 g/dL (ref 11.1–15.9)
Immature Grans (Abs): 0 10*3/uL (ref 0.0–0.1)
Immature Granulocytes: 0 %
Lymphocytes Absolute: 4.2 10*3/uL — ABNORMAL HIGH (ref 0.7–3.1)
Lymphs: 45 %
MCH: 31.4 pg (ref 26.6–33.0)
MCHC: 32.9 g/dL (ref 31.5–35.7)
MCV: 95 fL (ref 79–97)
Monocytes Absolute: 0.6 10*3/uL (ref 0.1–0.9)
Monocytes: 7 %
Neutrophils Absolute: 4.4 10*3/uL (ref 1.4–7.0)
Neutrophils: 48 %
Platelets: 315 10*3/uL (ref 150–450)
RBC: 4.55 x10E6/uL (ref 3.77–5.28)
RDW: 12 % (ref 11.7–15.4)
WBC: 9.3 10*3/uL (ref 3.4–10.8)

## 2024-04-09 LAB — LIPID PANEL W/O CHOL/HDL RATIO
Cholesterol, Total: 170 mg/dL (ref 100–199)
HDL: 46 mg/dL (ref >39)
LDL Chol Calc (NIH): 107 mg/dL — ABNORMAL HIGH (ref 0–99)
Triglycerides: 90 mg/dL (ref 0–149)
VLDL Cholesterol Cal: 17 mg/dL (ref 5–40)

## 2024-04-09 LAB — VITAMIN D 25 HYDROXY (VIT D DEFICIENCY, FRACTURES): Vit D, 25-Hydroxy: 25 ng/mL — ABNORMAL LOW (ref 30.0–100.0)

## 2024-04-09 NOTE — Progress Notes (Signed)
 Patient notified

## 2024-04-18 ENCOUNTER — Encounter: Admitting: Internal Medicine

## 2024-05-02 ENCOUNTER — Encounter: Payer: Self-pay | Admitting: Internal Medicine

## 2024-05-02 ENCOUNTER — Ambulatory Visit (INDEPENDENT_AMBULATORY_CARE_PROVIDER_SITE_OTHER): Admitting: Internal Medicine

## 2024-05-02 ENCOUNTER — Ambulatory Visit: Payer: Self-pay | Admitting: Internal Medicine

## 2024-05-02 VITALS — BP 130/84 | HR 108 | Ht 63.0 in | Wt 221.0 lb

## 2024-05-02 DIAGNOSIS — G8929 Other chronic pain: Secondary | ICD-10-CM | POA: Insufficient documentation

## 2024-05-02 DIAGNOSIS — M25551 Pain in right hip: Secondary | ICD-10-CM

## 2024-05-02 DIAGNOSIS — Z0001 Encounter for general adult medical examination with abnormal findings: Secondary | ICD-10-CM | POA: Diagnosis not present

## 2024-05-02 DIAGNOSIS — I340 Nonrheumatic mitral (valve) insufficiency: Secondary | ICD-10-CM

## 2024-05-02 DIAGNOSIS — M25552 Pain in left hip: Secondary | ICD-10-CM

## 2024-05-02 DIAGNOSIS — R7303 Prediabetes: Secondary | ICD-10-CM | POA: Diagnosis not present

## 2024-05-02 DIAGNOSIS — E559 Vitamin D deficiency, unspecified: Secondary | ICD-10-CM | POA: Insufficient documentation

## 2024-05-02 DIAGNOSIS — Z1231 Encounter for screening mammogram for malignant neoplasm of breast: Secondary | ICD-10-CM

## 2024-05-02 DIAGNOSIS — M25562 Pain in left knee: Secondary | ICD-10-CM

## 2024-05-02 DIAGNOSIS — Z124 Encounter for screening for malignant neoplasm of cervix: Secondary | ICD-10-CM

## 2024-05-02 DIAGNOSIS — M25561 Pain in right knee: Secondary | ICD-10-CM

## 2024-05-02 DIAGNOSIS — R768 Other specified abnormal immunological findings in serum: Secondary | ICD-10-CM

## 2024-05-02 DIAGNOSIS — E782 Mixed hyperlipidemia: Secondary | ICD-10-CM | POA: Diagnosis not present

## 2024-05-02 DIAGNOSIS — Z0000 Encounter for general adult medical examination without abnormal findings: Secondary | ICD-10-CM

## 2024-05-02 DIAGNOSIS — Z1389 Encounter for screening for other disorder: Secondary | ICD-10-CM

## 2024-05-02 DIAGNOSIS — Z1272 Encounter for screening for malignant neoplasm of vagina: Secondary | ICD-10-CM

## 2024-05-02 DIAGNOSIS — F411 Generalized anxiety disorder: Secondary | ICD-10-CM | POA: Insufficient documentation

## 2024-05-02 DIAGNOSIS — I10 Essential (primary) hypertension: Secondary | ICD-10-CM | POA: Diagnosis not present

## 2024-05-02 DIAGNOSIS — Z1151 Encounter for screening for human papillomavirus (HPV): Secondary | ICD-10-CM

## 2024-05-02 DIAGNOSIS — R9431 Abnormal electrocardiogram [ECG] [EKG]: Secondary | ICD-10-CM

## 2024-05-02 LAB — POCT URINALYSIS DIPSTICK
Bilirubin, UA: NEGATIVE
Glucose, UA: NEGATIVE
Ketones, UA: NEGATIVE
Nitrite, UA: NEGATIVE
Protein, UA: NEGATIVE
Spec Grav, UA: 1.02 (ref 1.010–1.025)
Urobilinogen, UA: 1 U/dL
pH, UA: 6.5 (ref 5.0–8.0)

## 2024-05-02 MED ORDER — DULOXETINE HCL 60 MG PO CPEP: 60.0000 mg | ORAL_CAPSULE | Freq: Every day | ORAL | 2 refills | Status: DC

## 2024-05-02 NOTE — Progress Notes (Addendum)
 Established Patient Office Visit  Subjective:  Patient ID: Sydney Nguyen, female    DOB: [birth year]  Age: 58 y.o. MRN: [id number]  Chief Complaint  Patient presents with  . Annual Exam    CPE    Patient comes in for follow-up and also needs Pap smear and mammogram.  At her last visit she was started on a few medications.  She had complained of heel, neck and other arthritic aches and pains.  She has completed her prednisone  course and is currently taking Celebrex  and baclofen , and almost all her arthritic aches and pains have resolved.  Her x-rays showed a heel spur but the neck was normal.  Patient now advised to only take her Celebrex  and baclofen  as needed. She is tolerating Cymbalta  and feels better and but there is room for improvement.  Will increase the dose to 60 mg/day. Pap smear and breast exam done today.  Mammogram scheduled.  Her baseline EKG has changes in the lateral wall.  Patient has a history of a murmur but did not have any complaints of chest pain, no palpitations and no shortness of.  Will schedule an echocardiogram first. She is still going to think about colonoscopy and EGD. Will refer to OB/GYN at next visit for IUD removal.   No other concerns at this time.   History reviewed. No pertinent past medical history.  History reviewed. No pertinent surgical history.  Social History   Socioeconomic History  . Marital status: Married    Spouse name: Not on file  . Number of children: Not on file  . Years of education: Not on file  . Highest education level: Not on file  Occupational History  . Not on file  Tobacco Use  . Smoking status: Never  . Smokeless tobacco: Never  Substance and Sexual Activity  . Alcohol use: Never  . Drug use: Never  . Sexual activity: Yes  Other Topics Concern  . Not on file  Social History Narrative   ** Merged History Encounter **       Social Drivers of Corporate investment banker Strain: Not on file  Food  Insecurity: Not on file  Transportation Needs: Not on file  Physical Activity: Not on file  Stress: Not on file  Social Connections: Not on file  Intimate Partner Violence: Not on file    Family History  Problem Relation Age of Onset  . Breast cancer Neg Hx     No Known Allergies  Outpatient Medications Prior to Visit  Medication Sig  . baclofen  (LIORESAL ) 10 MG tablet Take 1 tablet (10 mg total) by mouth daily.  . celecoxib  (CELEBREX ) 200 MG capsule Take 1 capsule (200 mg total) by mouth daily.  . losartan -hydrochlorothiazide  (HYZAAR) 50-12.5 MG tablet Take 1 tablet by mouth daily.  Aaron Aas omeprazole (PRILOSEC) 20 MG capsule Take 20 mg by mouth daily.  . [DISCONTINUED] DULoxetine  (CYMBALTA ) 30 MG capsule Take 1 capsule (30 mg total) by mouth daily.  . predniSONE  (DELTASONE ) 20 MG tablet Take 2 tablets (40 mg total) by mouth daily with breakfast. (Patient not taking: Reported on 05/02/2024)   No facility-administered medications prior to visit.    Review of Systems  Constitutional: Negative.  Negative for chills, fever, malaise/fatigue and weight loss.  HENT: Negative.  Negative for ear discharge and sore throat.   Eyes: Negative.   Respiratory: Negative.  Negative for cough and shortness of breath.   Cardiovascular: Negative.  Negative for chest pain, palpitations and  leg swelling.  Gastrointestinal: Negative.  Negative for abdominal pain, blood in stool, constipation, diarrhea, heartburn, melena, nausea and vomiting.  Genitourinary: Negative.  Negative for dysuria and flank pain.  Musculoskeletal: Negative.  Negative for back pain, joint pain, myalgias and neck pain.  Skin: Negative.   Neurological: Negative.  Negative for dizziness and headaches.  Endo/Heme/Allergies: Negative.   Psychiatric/Behavioral: Negative.  Negative for depression and suicidal ideas. The patient is not nervous/anxious.        Objective:   BP 130/84   Pulse (!) 108   Ht 5' 3 (1.6 m)   Wt 221 lb  (100.2 kg)   SpO2 96%   BMI 39.15 kg/m   Vitals:   05/02/24 1336  BP: 130/84  Pulse: (!) 108  Height: 5' 3 (1.6 m)  Weight: 221 lb (100.2 kg)  SpO2: 96%  BMI (Calculated): 39.16    Physical Exam Vitals and nursing note reviewed. Exam conducted with a chaperone present.  Constitutional:      Appearance: Normal appearance.  HENT:     Head: Normocephalic and atraumatic.     Nose: Nose normal.     Mouth/Throat:     Mouth: Mucous membranes are moist.     Pharynx: Oropharynx is clear.   Eyes:     Conjunctiva/sclera: Conjunctivae normal.     Pupils: Pupils are equal, round, and reactive to light.    Cardiovascular:     Rate and Rhythm: Normal rate and regular rhythm.     Pulses: Normal pulses.     Heart sounds: Murmur heard.  Pulmonary:     Effort: Pulmonary effort is normal.     Breath sounds: Normal breath sounds. No wheezing.  Chest:  Breasts:    Right: Normal. No swelling, bleeding, inverted nipple, mass, nipple discharge, skin change or tenderness.     Left: Normal. No swelling, bleeding, inverted nipple, mass, nipple discharge, skin change or tenderness.  Abdominal:     General: Bowel sounds are normal.     Palpations: Abdomen is soft.     Tenderness: There is no abdominal tenderness. There is no right CVA tenderness or left CVA tenderness.     Hernia: There is no hernia in the left inguinal area or right inguinal area.  Genitourinary:    General: Normal vulva.     Pubic Area: No rash or pubic lice.      Labia:        Right: No rash, tenderness, lesion or injury.        Left: No rash, tenderness, lesion or injury.      Urethra: No prolapse.     Vagina: Normal. No signs of injury and foreign body. No vaginal discharge, erythema, tenderness, bleeding, lesions or prolapsed vaginal walls.     Cervix: Normal.     Uterus: Normal.      Adnexa: Right adnexa normal and left adnexa normal.       Right: No mass, tenderness or fullness.         Left: No mass, tenderness  or fullness.     Musculoskeletal:        General: Normal range of motion.     Cervical back: Normal range of motion.     Right lower leg: No edema.     Left lower leg: No edema.  Lymphadenopathy:     Upper Body:     Right upper body: No supraclavicular, axillary or pectoral adenopathy.     Left upper body: No supraclavicular, axillary  or pectoral adenopathy.     Lower Body: No right inguinal adenopathy. No left inguinal adenopathy.   Skin:    General: Skin is warm and dry.   Neurological:     General: No focal deficit present.     Mental Status: She is alert and oriented to person, place, and time.   Psychiatric:        Mood and Affect: Mood normal.        Behavior: Behavior normal.     Results for orders placed or performed in visit on 05/02/24  POCT Urinalysis Dipstick (81002)  Result Value Ref Range   Color, UA Yellow    Clarity, UA Clear    Glucose, UA Negative Negative   Bilirubin, UA Negative    Ketones, UA Negative    Spec Grav, UA 1.020 1.010 - 1.025   Blood, UA Trace    pH, UA 6.5 5.0 - 8.0   Protein, UA Negative Negative   Urobilinogen, UA 1.0 0.2 or 1.0 E.U./dL   Nitrite, UA Negative    Leukocytes, UA Trace (A) Negative   Appearance Clear    Odor Yes     Recent Results (from the past 2160 hours)  CBC with Diff     Status: Abnormal   Collection Time: 04/08/24  9:02 AM  Result Value Ref Range   WBC 9.3 3.4 - 10.8 x10E3/uL   RBC 4.55 3.77 - 5.28 x10E6/uL   Hemoglobin 14.3 11.1 - 15.9 g/dL   Hematocrit 16.1 09.6 - 46.6 %   MCV 95 79 - 97 fL   MCH 31.4 26.6 - 33.0 pg   MCHC 32.9 31.5 - 35.7 g/dL   RDW 04.5 40.9 - 81.1 %   Platelets 315 150 - 450 x10E3/uL   Neutrophils 48 Not Estab. %   Lymphs 45 Not Estab. %   Monocytes 7 Not Estab. %   Eos 0 Not Estab. %   Basos 0 Not Estab. %   Neutrophils Absolute 4.4 1.4 - 7.0 x10E3/uL   Lymphocytes Absolute 4.2 (H) 0.7 - 3.1 x10E3/uL   Monocytes Absolute 0.6 0.1 - 0.9 x10E3/uL   EOS (ABSOLUTE) 0.0 0.0 - 0.4  x10E3/uL   Basophils Absolute 0.0 0.0 - 0.2 x10E3/uL   Immature Granulocytes 0 Not Estab. %   Immature Grans (Abs) 0.0 0.0 - 0.1 x10E3/uL  CMP14+EGFR     Status: Abnormal   Collection Time: 04/08/24  9:02 AM  Result Value Ref Range   Glucose 91 70 - 99 mg/dL   BUN 23 6 - 24 mg/dL   Creatinine, Ser 9.14 0.57 - 1.00 mg/dL   eGFR 79 >78 GN/FAO/1.30   BUN/Creatinine Ratio 26 (H) 9 - 23   Sodium 141 134 - 144 mmol/L   Potassium 3.8 3.5 - 5.2 mmol/L   Chloride 102 96 - 106 mmol/L   CO2 22 20 - 29 mmol/L   Calcium 9.5 8.7 - 10.2 mg/dL   Total Protein 6.9 6.0 - 8.5 g/dL   Albumin 4.4 3.8 - 4.9 g/dL   Globulin, Total 2.5 1.5 - 4.5 g/dL   Bilirubin Total 0.3 0.0 - 1.2 mg/dL   Alkaline Phosphatase 85 44 - 121 IU/L   AST 29 0 - 40 IU/L   ALT 52 (H) 0 - 32 IU/L  Lipid Panel w/o Chol/HDL Ratio     Status: Abnormal   Collection Time: 04/08/24  9:02 AM  Result Value Ref Range   Cholesterol, Total 170 100 - 199 mg/dL  Triglycerides 90 0 - 149 mg/dL   HDL 46 >84 mg/dL   VLDL Cholesterol Cal 17 5 - 40 mg/dL   LDL Chol Calc (NIH) 132 (H) 0 - 99 mg/dL  Hemoglobin G4W     Status: Abnormal   Collection Time: 04/08/24  9:02 AM  Result Value Ref Range   Hgb A1c MFr Bld 5.9 (H) 4.8 - 5.6 %    Comment:          Prediabetes: 5.7 - 6.4          Diabetes: >6.4          Glycemic control for adults with diabetes: <7.0    Est. average glucose Bld gHb Est-mCnc 123 mg/dL  Vitamin D  (25 hydroxy)     Status: Abnormal   Collection Time: 04/08/24  9:02 AM  Result Value Ref Range   Vit D, 25-Hydroxy 25.0 (L) 30.0 - 100.0 ng/mL    Comment: Vitamin D  deficiency has been defined by the Institute of Medicine and an Endocrine Society practice guideline as a level of serum 25-OH vitamin D  less than 20 ng/mL (1,2). The Endocrine Society went on to further define vitamin D  insufficiency as a level between 21 and 29 ng/mL (2). 1. IOM (Institute of Medicine). 2010. Dietary reference    intakes for calcium and D.  [place]: The    [employer]. 2. Holick MF, [place], Bischoff-Ferrari HA, et al.    Evaluation, treatment, and prevention of vitamin D     deficiency: an Endocrine Society clinical practice    guideline. JCEM. 2011 Jul; 96(7):1911-30.   TSH     Status: None   Collection Time: 04/08/24  9:02 AM  Result Value Ref Range   TSH 0.688 0.450 - 4.500 uIU/mL  POCT Urinalysis Dipstick (10272)     Status: Abnormal   Collection Time: 05/02/24  1:44 PM  Result Value Ref Range   Color, UA Yellow    Clarity, UA Clear    Glucose, UA Negative Negative   Bilirubin, UA Negative    Ketones, UA Negative    Spec Grav, UA 1.020 1.010 - 1.025   Blood, UA Trace    pH, UA 6.5 5.0 - 8.0   Protein, UA Negative Negative   Urobilinogen, UA 1.0 0.2 or 1.0 E.U./dL   Nitrite, UA Negative    Leukocytes, UA Trace (A) Negative   Appearance Clear    Odor Yes       Assessment & Plan:  Continue current medications.  Increase Cymbalta  to 60 mg/day.  Schedule mammogram and echocardiogram.  Will address OB/GYN referral and GI referral at next visit. Problem List Items Addressed This Visit     Essential hypertension, benign   Relevant Orders   EKG 12-Lead   Mixed hyperlipidemia   Prediabetes   Vitamin D  deficiency   Chronic arthralgias of knees and hips   Relevant Medications   DULoxetine  (CYMBALTA ) 60 MG capsule   Other Relevant Orders   Arthritis Panel   GAD (generalized anxiety disorder)   Relevant Medications   DULoxetine  (CYMBALTA ) 60 MG capsule   Other Visit Diagnoses       Health maintenance examination    -  Primary   Relevant Orders   IGP, Aptima HPV     Screening for blood or protein in urine       Relevant Orders   POCT Urinalysis Dipstick (53664) (Completed)     Breast cancer screening by mammogram  Relevant Orders   MM 3D SCREENING MAMMOGRAM BILATERAL BREAST     Vaginal Pap smear       Relevant Orders   IGP, Aptima HPV     Screening for cervical cancer        Relevant Orders   IGP, Aptima HPV     Screening for human papillomavirus (HPV)       Relevant Orders   IGP, Aptima HPV     EKG, abnormal         Nonrheumatic mitral valve regurgitation       Relevant Orders   PCV ECHOCARDIOGRAM COMPLETE       Return in about 1 month (around 06/01/2024).   Total time spent: 30 minutes  Aisha Hove, MD  05/02/2024   This document may have been prepared by [facility] Voice Recognition software and as such may include unintentional dictation errors.

## 2024-05-05 LAB — ARTHRITIS PANEL
Anti Nuclear Antibody (ANA): POSITIVE — AB
Rheumatoid fact SerPl-aCnc: <10 [IU]/mL (ref <14.0)
Sed Rate: 4 mm/h (ref 0–40)
Uric Acid: 6 mg/dL (ref 3.0–7.2)

## 2024-05-06 ENCOUNTER — Other Ambulatory Visit: Payer: Self-pay | Admitting: Internal Medicine

## 2024-05-06 DIAGNOSIS — R768 Other specified abnormal immunological findings in serum: Secondary | ICD-10-CM

## 2024-05-06 LAB — IGP, APTIMA HPV
HPV Aptima: NEGATIVE
PAP Smear Comment: 0

## 2024-05-07 NOTE — Progress Notes (Signed)
 Patient notified

## 2024-05-08 LAB — ANTINUCLEAR ANTIBODIES, IFA: ANA Titer 1: NEGATIVE

## 2024-05-08 LAB — SPECIMEN STATUS REPORT

## 2024-05-30 ENCOUNTER — Ambulatory Visit

## 2024-05-30 DIAGNOSIS — I340 Nonrheumatic mitral (valve) insufficiency: Secondary | ICD-10-CM

## 2024-05-30 DIAGNOSIS — I361 Nonrheumatic tricuspid (valve) insufficiency: Secondary | ICD-10-CM

## 2024-05-30 DIAGNOSIS — I371 Nonrheumatic pulmonary valve insufficiency: Secondary | ICD-10-CM | POA: Diagnosis not present

## 2024-06-02 ENCOUNTER — Ambulatory Visit (INDEPENDENT_AMBULATORY_CARE_PROVIDER_SITE_OTHER): Admitting: Internal Medicine

## 2024-06-02 ENCOUNTER — Encounter: Payer: Self-pay | Admitting: Internal Medicine

## 2024-06-02 VITALS — BP 110/64 | HR 94 | Ht 63.0 in | Wt 217.8 lb

## 2024-06-02 DIAGNOSIS — L304 Erythema intertrigo: Secondary | ICD-10-CM | POA: Diagnosis not present

## 2024-06-02 DIAGNOSIS — Z30432 Encounter for removal of intrauterine contraceptive device: Secondary | ICD-10-CM | POA: Diagnosis not present

## 2024-06-02 DIAGNOSIS — I10 Essential (primary) hypertension: Secondary | ICD-10-CM

## 2024-06-02 DIAGNOSIS — E782 Mixed hyperlipidemia: Secondary | ICD-10-CM | POA: Diagnosis not present

## 2024-06-02 DIAGNOSIS — Z1211 Encounter for screening for malignant neoplasm of colon: Secondary | ICD-10-CM

## 2024-06-02 DIAGNOSIS — R7303 Prediabetes: Secondary | ICD-10-CM

## 2024-06-02 DIAGNOSIS — F411 Generalized anxiety disorder: Secondary | ICD-10-CM

## 2024-06-02 MED ORDER — NYSTATIN 100000 UNIT/GM EX POWD: 1.0000 | Freq: Two times a day (BID) | CUTANEOUS | 3 refills | Status: AC

## 2024-06-02 MED ORDER — CLOTRIMAZOLE-BETAMETHASONE 1-0.05 % EX CREA: 1.0000 | TOPICAL_CREAM | Freq: Two times a day (BID) | CUTANEOUS | 0 refills | Status: AC

## 2024-06-02 NOTE — Progress Notes (Signed)
 Established Patient Office Visit  Subjective:  Patient ID: Sydney Nguyen, female    DOB: 1965-12-10  Age: 58 y.o. MRN: [id number]  Chief Complaint  Patient presents with   Follow-up    1 month follow up    Patient comes in for her follow-up today.  She is taking all her medications and is tolerating them well.  She is currently on Cymbalta  60 mg and that is helping her.  Patient has been able to control her diet and increasing physical activity and as a result has lost some weight.  She has an itchy rash under her breasts, will send in Lotrisone  cream and nystatin  powder. Also needs Cologuard, order sent. Patient needs her IUD removed, will send a referral to OB/GYN. Waiting to get her mammogram scheduled.    No other concerns at this time.   History reviewed. No pertinent past medical history.  History reviewed. No pertinent surgical history.  Social History   Socioeconomic History   Marital status: Married    Spouse name: Not on file   Number of children: Not on file   Years of education: Not on file   Highest education level: Not on file  Occupational History   Not on file  Tobacco Use   Smoking status: Never   Smokeless tobacco: Never  Substance and Sexual Activity   Alcohol use: Never   Drug use: Never   Sexual activity: Yes  Other Topics Concern   Not on file  Social History Narrative   ** Merged History Encounter **       Social Drivers of Health   Financial Resource Strain: Not on file  Food Insecurity: Not on file  Transportation Needs: Not on file  Physical Activity: Not on file  Stress: Not on file  Social Connections: Not on file  Intimate Partner Violence: Not on file    Family History  Problem Relation Age of Onset   Breast cancer Neg Hx     No Known Allergies  Outpatient Medications Prior to Visit  Medication Sig   baclofen  (LIORESAL ) 10 MG tablet Take 1 tablet (10 mg total) by mouth daily.   celecoxib  (CELEBREX ) 200 MG  capsule Take 1 capsule (200 mg total) by mouth daily.   DULoxetine  (CYMBALTA ) 60 MG capsule Take 1 capsule (60 mg total) by mouth daily.   losartan -hydrochlorothiazide  (HYZAAR) 50-12.5 MG tablet Take 1 tablet by mouth daily.   omeprazole (PRILOSEC) 20 MG capsule Take 20 mg by mouth daily.   predniSONE  (DELTASONE ) 20 MG tablet Take 2 tablets (40 mg total) by mouth daily with breakfast. (Patient not taking: Reported on 06/02/2024)   No facility-administered medications prior to visit.    Review of Systems  Constitutional: Negative.  Negative for chills, diaphoresis, fever and malaise/fatigue.  HENT: Negative.  Negative for sore throat.   Eyes: Negative.   Respiratory: Negative.  Negative for cough and shortness of breath.   Cardiovascular: Negative.  Negative for chest pain, palpitations and leg swelling.  Gastrointestinal: Negative.  Negative for abdominal pain, constipation, diarrhea, heartburn, nausea and vomiting.  Genitourinary: Negative.  Negative for dysuria and flank pain.  Musculoskeletal: Negative.  Negative for joint pain and myalgias.  Skin: Negative.   Neurological: Negative.  Negative for dizziness, tingling, tremors and headaches.  Endo/Heme/Allergies: Negative.   Psychiatric/Behavioral: Negative.  Negative for depression and suicidal ideas. The patient is not nervous/anxious.        Objective:   BP 110/64   Pulse 94  Ht 5' 3 (1.6 m)   Wt 217 lb 12.8 oz (98.8 kg)   SpO2 96%   BMI 38.58 kg/m   Vitals:   06/02/24 1304  BP: 110/64  Pulse: 94  Height: 5' 3 (1.6 m)  Weight: 217 lb 12.8 oz (98.8 kg)  SpO2: 96%  BMI (Calculated): 38.59    Physical Exam Vitals and nursing note reviewed.  Constitutional:      Appearance: Normal appearance.  HENT:     Head: Normocephalic and atraumatic.     Nose: Nose normal.     Mouth/Throat:     Mouth: Mucous membranes are moist.     Pharynx: Oropharynx is clear.  Eyes:     Conjunctiva/sclera: Conjunctivae normal.      Pupils: Pupils are equal, round, and reactive to light.  Cardiovascular:     Rate and Rhythm: Normal rate and regular rhythm.     Pulses: Normal pulses.     Heart sounds: Normal heart sounds. No murmur heard. Pulmonary:     Effort: Pulmonary effort is normal.     Breath sounds: Normal breath sounds. No wheezing.  Abdominal:     General: Bowel sounds are normal.     Palpations: Abdomen is soft.     Tenderness: There is no abdominal tenderness. There is no right CVA tenderness or left CVA tenderness.  Musculoskeletal:        General: Normal range of motion.     Cervical back: Normal range of motion.     Right lower leg: No edema.     Left lower leg: No edema.  Skin:    General: Skin is warm and dry.  Neurological:     General: No focal deficit present.     Mental Status: She is alert and oriented to person, place, and time.  Psychiatric:        Mood and Affect: Mood normal.        Behavior: Behavior normal.      No results found for any visits on 06/02/24.  Recent Results (from the past 2160 hours)  CBC with Diff     Status: Abnormal   Collection Time: 04/08/24  9:02 AM  Result Value Ref Range   WBC 9.3 3.4 - 10.8 x10E3/uL   RBC 4.55 3.77 - 5.28 x10E6/uL   Hemoglobin 14.3 11.1 - 15.9 g/dL   Hematocrit 56.5 65.9 - 46.6 %   MCV 95 79 - 97 fL   MCH 31.4 26.6 - 33.0 pg   MCHC 32.9 31.5 - 35.7 g/dL   RDW 87.9 88.2 - 84.5 %   Platelets 315 150 - 450 x10E3/uL   Neutrophils 48 Not Estab. %   Lymphs 45 Not Estab. %   Monocytes 7 Not Estab. %   Eos 0 Not Estab. %   Basos 0 Not Estab. %   Neutrophils Absolute 4.4 1.4 - 7.0 x10E3/uL   Lymphocytes Absolute 4.2 (H) 0.7 - 3.1 x10E3/uL   Monocytes Absolute 0.6 0.1 - 0.9 x10E3/uL   EOS (ABSOLUTE) 0.0 0.0 - 0.4 x10E3/uL   Basophils Absolute 0.0 0.0 - 0.2 x10E3/uL   Immature Granulocytes 0 Not Estab. %   Immature Grans (Abs) 0.0 0.0 - 0.1 x10E3/uL  CMP14+EGFR     Status: Abnormal   Collection Time: 04/08/24  9:02 AM  Result Value  Ref Range   Glucose 91 70 - 99 mg/dL   BUN 23 6 - 24 mg/dL   Creatinine, Ser 9.12 0.57 - 1.00 mg/dL  eGFR 79 >59 mL/min/1.73   BUN/Creatinine Ratio 26 (H) 9 - 23   Sodium 141 134 - 144 mmol/L   Potassium 3.8 3.5 - 5.2 mmol/L   Chloride 102 96 - 106 mmol/L   CO2 22 20 - 29 mmol/L   Calcium 9.5 8.7 - 10.2 mg/dL   Total Protein 6.9 6.0 - 8.5 g/dL   Albumin 4.4 3.8 - 4.9 g/dL   Globulin, Total 2.5 1.5 - 4.5 g/dL   Bilirubin Total 0.3 0.0 - 1.2 mg/dL   Alkaline Phosphatase 85 44 - 121 IU/L   AST 29 0 - 40 IU/L   ALT 52 (H) 0 - 32 IU/L  Lipid Panel w/o Chol/HDL Ratio     Status: Abnormal   Collection Time: 04/08/24  9:02 AM  Result Value Ref Range   Cholesterol, Total 170 100 - 199 mg/dL   Triglycerides 90 0 - 149 mg/dL   HDL 46 >60 mg/dL   VLDL Cholesterol Cal 17 5 - 40 mg/dL   LDL Chol Calc (NIH) 892 (H) 0 - 99 mg/dL  Hemoglobin J8r     Status: Abnormal   Collection Time: 04/08/24  9:02 AM  Result Value Ref Range   Hgb A1c MFr Bld 5.9 (H) 4.8 - 5.6 %    Comment:          Prediabetes: 5.7 - 6.4          Diabetes: >6.4          Glycemic control for adults with diabetes: <7.0    Est. average glucose Bld gHb Est-mCnc 123 mg/dL  Vitamin D  (25 hydroxy)     Status: Abnormal   Collection Time: 04/08/24  9:02 AM  Result Value Ref Range   Vit D, 25-Hydroxy 25.0 (L) 30.0 - 100.0 ng/mL    Comment: Vitamin D  deficiency has been defined by the Institute of Medicine and an Endocrine Society practice guideline as a level of serum 25-OH vitamin D  less than 20 ng/mL (1,2). The Endocrine Society went on to further define vitamin D  insufficiency as a level between 21 and 29 ng/mL (2). 1. IOM (Institute of Medicine). 2010. Dietary reference    intakes for calcium and D. [place]: The    [employer]. 2. Holick MF, [place], Bischoff-Ferrari HA, et al.    Evaluation, treatment, and prevention of vitamin D     deficiency: an Endocrine Society clinical practice    guideline.  JCEM. 2011 Jul; 96(7):1911-30.   TSH     Status: None   Collection Time: 04/08/24  9:02 AM  Result Value Ref Range   TSH 0.688 0.450 - 4.500 uIU/mL  POCT Urinalysis Dipstick (18997)     Status: Abnormal   Collection Time: 05/02/24  1:44 PM  Result Value Ref Range   Color, UA Yellow    Clarity, UA Clear    Glucose, UA Negative Negative   Bilirubin, UA Negative    Ketones, UA Negative    Spec Grav, UA 1.020 1.010 - 1.025   Blood, UA Trace    pH, UA 6.5 5.0 - 8.0   Protein, UA Negative Negative   Urobilinogen, UA 1.0 0.2 or 1.0 E.U./dL   Nitrite, UA Negative    Leukocytes, UA Trace (A) Negative   Appearance Clear    Odor Yes   Arthritis Panel     Status: Abnormal   Collection Time: 05/02/24  2:39 PM  Result Value Ref Range   Uric Acid 6.0 3.0 - 7.2 mg/dL  Comment:            Therapeutic target for gout patients: <6.0   Anti Nuclear Antibody (ANA) Positive (A) Negative   Rheumatoid fact SerPl-aCnc <10.0 <14.0 IU/mL   Sed Rate 4 0 - 40 mm/hr  ANA, IFA (with reflex)     Status: None   Collection Time: 05/02/24  2:39 PM  Result Value Ref Range   ANA Titer 1 Negative     Comment:                                      Negative   <1:80                                      Borderline  1:80                                      Positive   >1:80 ICAP nomenclature: AC-0 For more information about Hep-2 cell patterns use ANApatterns.org, the official website for the International Consensus on Antinuclear Antibody (ANA) Patterns (ICAP).   Specimen status report     Status: None   Collection Time: 05/02/24  2:39 PM  Result Value Ref Range   specimen status report Comment     Comment: Written Authorization Written Authorization Written Authorization Received. Authorization received from neclam Chynah Orihuela 05-06-2024 Logged by Frankey Pepper   IGP, Aptima HPV     Status: None   Collection Time: 05/02/24  4:00 PM  Result Value Ref Range   DIAGNOSIS: Comment     Comment: NEGATIVE FOR  INTRAEPITHELIAL LESION OR MALIGNANCY.   Specimen adequacy: Comment     Comment: Satisfactory for evaluation. Endocervical and/or squamous metaplastic cells (endocervical component) are present.    Clinician Provided ICD10 Comment     Comment: Z00.00 Z12.72 Z12.4 Z11.51    Performed by: Comment     Comment: Camelia Dimes, Cytologist (ASCP)   PAP Smear Comment .    Note: Comment     Comment: The Pap smear is a screening test designed to aid in the detection of premalignant and malignant conditions of the uterine cervix.  It is not a diagnostic procedure and should not be used as the sole means of detecting cervical cancer.  Both false-positive and false-negative reports do occur.    Test Methodology Comment     Comment: This liquid based ThinPrep(R) pap test was interpreted using the Hologic(R) Genius(TM) Cervical Algorithm whole slide imaging system.    HPV Aptima Negative Negative    Comment: This nucleic acid amplification test detects fourteen high-risk HPV types (16,18,31,33,35,39,45,51,52,56,58,59,66,68) without differentiation.       Assessment & Plan:  Continue current medications.  Strict diet control and exercise. Problem List Items Addressed This Visit     Essential hypertension, benign - Primary   Mixed hyperlipidemia   Prediabetes   GAD (generalized anxiety disorder)   Other Visit Diagnoses       Colon cancer screening       Relevant Orders   Cologuard     Intertrigo       Relevant Medications   clotrimazole -betamethasone  (LOTRISONE ) cream   nystatin  powder     Awaiting removal of contraceptive intrauterine device (IUD)  Relevant Orders   Ambulatory referral to Obstetrics / Gynecology       Return in about 3 months (around 09/02/2024).   Total time spent: 30 minutes  FERNAND FREDY RAMAN, MD  06/02/2024   This document may have been prepared by [facility] Voice Recognition software and as such may include unintentional dictation errors.

## 2024-06-04 ENCOUNTER — Encounter: Payer: Self-pay | Admitting: Obstetrics and Gynecology

## 2024-06-05 ENCOUNTER — Other Ambulatory Visit: Payer: Self-pay | Admitting: Internal Medicine

## 2024-06-05 DIAGNOSIS — M542 Cervicalgia: Secondary | ICD-10-CM

## 2024-06-18 ENCOUNTER — Ambulatory Visit: Payer: Self-pay | Admitting: Cardiology

## 2024-06-18 LAB — COLOGUARD: COLOGUARD: NEGATIVE

## 2024-06-18 NOTE — Progress Notes (Signed)
Pt informed

## 2024-07-24 ENCOUNTER — Other Ambulatory Visit: Payer: Self-pay | Admitting: Internal Medicine

## 2024-07-24 DIAGNOSIS — M542 Cervicalgia: Secondary | ICD-10-CM

## 2024-07-24 DIAGNOSIS — M79672 Pain in left foot: Secondary | ICD-10-CM

## 2024-07-25 MED ORDER — BACLOFEN 10 MG PO TABS: 10.0000 mg | ORAL_TABLET | ORAL | 1 refills | Status: DC | PRN

## 2024-07-25 MED ORDER — CELECOXIB 200 MG PO CAPS: 200.0000 mg | ORAL_CAPSULE | Freq: Every day | ORAL | 1 refills | Status: DC

## 2024-07-30 ENCOUNTER — Encounter: Admitting: Obstetrics and Gynecology

## 2024-08-22 ENCOUNTER — Ambulatory Visit (INDEPENDENT_AMBULATORY_CARE_PROVIDER_SITE_OTHER): Admitting: Licensed Practical Nurse

## 2024-08-22 ENCOUNTER — Encounter: Payer: Self-pay | Admitting: Licensed Practical Nurse

## 2024-08-22 VITALS — BP 115/87 | HR 87 | Ht 63.0 in | Wt 222.4 lb

## 2024-08-22 DIAGNOSIS — Z30432 Encounter for removal of intrauterine contraceptive device: Secondary | ICD-10-CM

## 2024-08-22 NOTE — Progress Notes (Signed)
   SUBJECTIVE: Here for IUD removal. She had an IUD inserted 20 years ago. She insertion process was painful so she put off having it removed out worry for discomfort. She has been seeing her PCP, they were concerned about potential difficulty removing an IUD that has been present for 20 years, so she was referred here.  Sydney Nguyen has not had a cycle for a number of years, she does experience hot flashes.    OBJECTIVE: BP 115/87 (BP Location: Right Arm, Patient Position: Sitting, Cuff Size: Large)   Pulse 87   Ht 5' 3 (1.6 m)   Wt 222 lb 6.4 oz (100.9 kg)   BMI 39.40 kg/m   GEN: NAD   GYNECOLOGY OFFICE PROCEDURE NOTE  Sydney Nguyen is a 58 y.o. No obstetric history on file. here for Liletta IUD removal. No GYN concerns.  Last pap smear was on 04/2024 and was normal.  IUD Removal  Patient identified, informed consent performed, consent signed.  Patient was in the dorsal lithotomy position, normal external genitalia was noted.  A speculum was placed in the patient's vagina, normal discharge was noted, no lesions. The cervix was visualized, no lesions, no abnormal discharge.  The strings of the IUD were grasped and pulled using ring forceps. The IUD was removed in its entirety. And shown to pt.  Patient tolerated the procedure well.     -We can assume you have gone through menopause, labs offered and declined. Please return if you experience vaginal bleeding.   Jinnie Cookey, CNM  [facility] 08/22/24  4:04 PM

## 2024-09-02 ENCOUNTER — Ambulatory Visit (INDEPENDENT_AMBULATORY_CARE_PROVIDER_SITE_OTHER): Admitting: Internal Medicine

## 2024-09-02 ENCOUNTER — Encounter: Payer: Self-pay | Admitting: Internal Medicine

## 2024-09-02 VITALS — BP 120/84 | HR 90 | Ht 63.0 in | Wt 222.0 lb

## 2024-09-02 DIAGNOSIS — K219 Gastro-esophageal reflux disease without esophagitis: Secondary | ICD-10-CM

## 2024-09-02 DIAGNOSIS — M79672 Pain in left foot: Secondary | ICD-10-CM

## 2024-09-02 DIAGNOSIS — M542 Cervicalgia: Secondary | ICD-10-CM

## 2024-09-02 DIAGNOSIS — R7303 Prediabetes: Secondary | ICD-10-CM

## 2024-09-02 DIAGNOSIS — R635 Abnormal weight gain: Secondary | ICD-10-CM

## 2024-09-02 DIAGNOSIS — E782 Mixed hyperlipidemia: Secondary | ICD-10-CM | POA: Diagnosis not present

## 2024-09-02 DIAGNOSIS — R5383 Other fatigue: Secondary | ICD-10-CM

## 2024-09-02 DIAGNOSIS — F411 Generalized anxiety disorder: Secondary | ICD-10-CM | POA: Diagnosis not present

## 2024-09-02 DIAGNOSIS — I10 Essential (primary) hypertension: Secondary | ICD-10-CM | POA: Diagnosis not present

## 2024-09-02 MED ORDER — BACLOFEN 10 MG PO TABS
10.0000 mg | ORAL_TABLET | ORAL | 1 refills | Status: DC | PRN
Start: 2024-09-02 — End: 2024-09-26

## 2024-09-02 MED ORDER — LOSARTAN POTASSIUM-HCTZ 50-12.5 MG PO TABS: 1.0000 | ORAL_TABLET | Freq: Every day | ORAL | 3 refills | Status: AC

## 2024-09-02 MED ORDER — CELECOXIB 200 MG PO CAPS
200.0000 mg | ORAL_CAPSULE | Freq: Every day | ORAL | 1 refills | Status: DC
Start: 2024-09-02 — End: 2024-09-26

## 2024-09-02 MED ORDER — DULOXETINE HCL 60 MG PO CPEP: 60.0000 mg | ORAL_CAPSULE | Freq: Every day | ORAL | 2 refills | Status: AC

## 2024-09-02 MED ORDER — OMEPRAZOLE 20 MG PO CPDR
20.0000 mg | DELAYED_RELEASE_CAPSULE | Freq: Every day | ORAL | 2 refills | Status: AC
Start: 2024-09-02 — End: ?

## 2024-09-02 NOTE — Progress Notes (Signed)
 Established Patient Office Visit  Subjective:  Patient ID: Sydney Nguyen, female    DOB: [birth year]  Age: 58 y.o. MRN: [id number]  Chief Complaint  Patient presents with   Follow-up    3 month follow up    Patient comes in for follow up.Generally feels well. Did not get her mammogram yet- will call and schedule. Needs labs today. Continues to have neck and heel pain, and also right knee pain, but all respond well to Celebrex  and Baclofen . Will continue the regimen unless knee pain gets worse. Had xrays at [facility] ortho before and found to have OA. Will refer there again if needed. Actively trying to lose weight with diet control and exercise, but feels more toned now and clothes are fitting better- advised to continue.    No other concerns at this time.   History reviewed. No pertinent past medical history.  History reviewed. No pertinent surgical history.  Social History   Socioeconomic History   Marital status: Married    Spouse name: Not on file   Number of children: Not on file   Years of education: Not on file   Highest education level: Not on file  Occupational History   Not on file  Tobacco Use   Smoking status: Never   Smokeless tobacco: Never  Substance and Sexual Activity   Alcohol use: Never   Drug use: Never   Sexual activity: Not Currently    Birth control/protection: None  Other Topics Concern   Not on file  Social History Narrative   ** Merged History Encounter **       Social Drivers of Corporate investment banker Strain: Not on file  Food Insecurity: Not on file  Transportation Needs: Not on file  Physical Activity: Not on file  Stress: Not on file  Social Connections: Not on file  Intimate Partner Violence: Not on file    Family History  Problem Relation Age of Onset   Breast cancer Neg Hx     No Known Allergies  Outpatient Medications Prior to Visit  Medication Sig   clotrimazole -betamethasone  (LOTRISONE ) cream Apply 1  Application topically 2 (two) times daily.   nystatin  powder Apply 1 Application topically 2 (two) times daily.   [DISCONTINUED] baclofen  (LIORESAL ) 10 MG tablet Take 1 tablet (10 mg total) by mouth as needed for muscle spasms.   [DISCONTINUED] celecoxib  (CELEBREX ) 200 MG capsule Take 1 capsule (200 mg total) by mouth daily.   [DISCONTINUED] DULoxetine  (CYMBALTA ) 60 MG capsule Take 1 capsule (60 mg total) by mouth daily.   [DISCONTINUED] losartan -hydrochlorothiazide  (HYZAAR) 50-12.5 MG tablet Take 1 tablet by mouth daily.   [DISCONTINUED] omeprazole (PRILOSEC) 20 MG capsule Take 20 mg by mouth daily.   [DISCONTINUED] predniSONE  (DELTASONE ) 20 MG tablet Take 2 tablets (40 mg total) by mouth daily with breakfast. (Patient not taking: Reported on 09/02/2024)   No facility-administered medications prior to visit.    Review of Systems  Constitutional: Negative.  Negative for chills, fever and malaise/fatigue.  HENT: Negative.  Negative for congestion and sore throat.   Eyes: Negative.  Negative for blurred vision and pain.  Respiratory: Negative.  Negative for cough and shortness of breath.   Cardiovascular: Negative.  Negative for chest pain, palpitations and leg swelling.  Gastrointestinal: Negative.  Negative for abdominal pain, blood in stool, constipation, diarrhea, heartburn, melena, nausea and vomiting.  Genitourinary: Negative.  Negative for dysuria, flank pain, frequency and urgency.  Musculoskeletal:  Positive for joint pain (Right  knee) and neck pain. Negative for myalgias.  Skin: Negative.   Neurological: Negative.  Negative for dizziness, tingling, sensory change, weakness and headaches.  Endo/Heme/Allergies: Negative.   Psychiatric/Behavioral: Negative.  Negative for depression and suicidal ideas. The patient is not nervous/anxious.        Objective:   BP 120/84   Pulse 90   Ht 5' 3 (1.6 m)   Wt 222 lb (100.7 kg)   SpO2 96%   BMI 39.33 kg/m   Vitals:   09/02/24 1302   BP: 120/84  Pulse: 90  Height: 5' 3 (1.6 m)  Weight: 222 lb (100.7 kg)  SpO2: 96%  BMI (Calculated): 39.34    Physical Exam Vitals and nursing note reviewed.  Constitutional:      Appearance: Normal appearance.  HENT:     Head: Normocephalic and atraumatic.     Nose: Nose normal.     Mouth/Throat:     Mouth: Mucous membranes are moist.     Pharynx: Oropharynx is clear.  Eyes:     Conjunctiva/sclera: Conjunctivae normal.     Pupils: Pupils are equal, round, and reactive to light.  Cardiovascular:     Rate and Rhythm: Normal rate and regular rhythm.     Pulses: Normal pulses.     Heart sounds: Normal heart sounds. No murmur heard. Pulmonary:     Effort: Pulmonary effort is normal.     Breath sounds: Normal breath sounds. No wheezing.  Abdominal:     General: Bowel sounds are normal.     Palpations: Abdomen is soft.     Tenderness: There is no abdominal tenderness. There is no right CVA tenderness or left CVA tenderness.  Musculoskeletal:        General: Normal range of motion.     Cervical back: Normal range of motion.     Right lower leg: No edema.     Left lower leg: No edema.  Skin:    General: Skin is warm and dry.  Neurological:     General: No focal deficit present.     Mental Status: She is alert and oriented to person, place, and time.  Psychiatric:        Mood and Affect: Mood normal.        Behavior: Behavior normal.      No results found for any visits on 09/02/24.  Recent Results (from the past 2160 hours)  Cologuard     Status: None   Collection Time: 06/13/24  6:30 AM  Result Value Ref Range   COLOGUARD Negative Negative    Comment: The Cologuard (TM) test was performed on this specimen.  NEGATIVE TEST RESULT. A negative Cologuard result indicates a low likelihood that a colorectal cancer (CRC) or advanced adenoma (adenomatous polyps with more advanced pre-malignant features) is present. The chance that a person with a negative Cologuard test  has a colorectal cancer is less than 1 in 1500 (negative predictive value >99.9%) or has an advanced adenoma is less than 5.3% (negative predictive value 94.7%). These data are based on a prospective cross-sectional study of 10,000 individuals at average risk for colorectal cancer who were screened with both Cologuard and colonoscopy. (Imperiale T. et al, N Engl J Med 2014;370(14):1286-1297) The normal value (reference range) for this assay is negative.  COLOGUARD RE-SCREENING RECOMMENDATION: Periodic colorectal cancer screening is an important part of preventive healthcare for asymptomatic individuals at average risk for colorectal cancer. Following a negative Cologuard  result, the American Cancer Society and  U.S. Multi-Society Task Force screening guidelines recommend a Cologuard re-screening interval of 3 years.  References: American Cancer Society Guideline for Colorectal Cancer Screening: https://www.cancer.org/cancer/colon-rectal-cancer/detection-diagnosis-staging/acs-recommendations.html.; Rex DK, Boland CR, Dominitz JK, Colorectal Cancer Screening: Recommendations for Physicians and Patients from the U.S. Multi-Society Task Force on Colorectal Cancer Screening , Am J Gastroenterology 2017; 112:1016-1030.  TEST DESCRIPTION: Composite algorithmic analysis of stool DNA-biomarkers with hemoglobin immunoassay.   Quantitative values of individual biomarkers are not reportable and are not associated with individual biomarker result reference ranges. Cologuard is intended for colorectal cancer screening of adults of either sex, 45 years or older, who are at average-risk for colorectal cancer (CRC). Cologuard has been approved for use by the U.[place]  FDA. The performance of Cologuard was established in a cross sectional study of average-risk adults aged 53-84. Cologuard performance in patients ages 31 to 49 years was estimated by sub-group analysis of near-age groups. Colonoscopies performed for a positive result  may find as the most clinically significant lesion: colorectal cancer [4.0%], advanced adenoma (including sessile serrated polyps greater than or equal to 1cm diameter) [20%] or non- advanced adenoma [31%]; or no colorectal neoplasia [45%]. These estimates are derived from a prospective cross-sectional screening study of 10,000 individuals at average risk for colorectal cancer who were screened with both Cologuard and colonoscopy. (Imperiale T. et al, LOISE [place] J Med 2014;370(14):1286-1297.) Cologuard may produce a false negative or false positive result (no colorectal cancer or precancerous polyp present at colonoscopy follow up). A negative Cologuard test result does not guarantee the absence of CRC or advanced adenoma  (pre-cancer). The current Cologuard screening interval is every 3 years. Science writer and [place] Therapist, music). Cologuard performance data in a 10,000 patient pivotal study using colonoscopy as the reference method can be accessed at the following location: www.exactlabs.com/results. Additional description of the Cologuard test process, warnings and precautions can be found at www.cologuard.com.       Assessment & Plan:  Continue meds, check labs. Mammogram- Problem List Items Addressed This Visit     Essential hypertension, benign - Primary   Relevant Medications   losartan -hydrochlorothiazide  (HYZAAR) 50-12.5 MG tablet   Other Relevant Orders   CMP14+EGFR   Mixed hyperlipidemia   Relevant Medications   losartan -hydrochlorothiazide  (HYZAAR) 50-12.5 MG tablet   Other Relevant Orders   Lipid Panel w/o Chol/HDL Ratio   Prediabetes   Relevant Orders   Hemoglobin A1c   Gastroesophageal reflux disease without esophagitis   Relevant Medications   omeprazole (PRILOSEC) 20 MG capsule   Other Relevant Orders   CBC with Diff   Weight gain   Relevant Orders   Vitamin D  (25 hydroxy)   GAD (generalized anxiety disorder)   Relevant Medications   DULoxetine   (CYMBALTA ) 60 MG capsule   Other Relevant Orders   Vitamin D  (25 hydroxy)   Other Visit Diagnoses       Other fatigue       Relevant Orders   Vitamin D  (25 hydroxy)   TSH     Essential hypertension       Relevant Medications   losartan -hydrochlorothiazide  (HYZAAR) 50-12.5 MG tablet     Neck pain       Relevant Medications   baclofen  (LIORESAL ) 10 MG tablet   celecoxib  (CELEBREX ) 200 MG capsule     Pain of left heel       Relevant Medications   celecoxib  (CELEBREX ) 200 MG capsule       Return in about 3 months (around 12/03/2024).  Total time spent: 30 minutes.  This time includes review of previous notes and results and patient face to face interaction during today's visit.    FERNAND FREDY RAMAN, MD  09/02/2024   This document may have been prepared by [facility] Voice Recognition software and as such may include unintentional dictation errors.

## 2024-09-03 ENCOUNTER — Ambulatory Visit: Payer: Self-pay | Admitting: Internal Medicine

## 2024-09-03 LAB — VITAMIN D 25 HYDROXY (VIT D DEFICIENCY, FRACTURES): Vit D, 25-Hydroxy: 22.4 ng/mL — ABNORMAL LOW (ref 30.0–100.0)

## 2024-09-03 LAB — CBC WITH DIFFERENTIAL/PLATELET
Basophils Absolute: 0.1 x10E3/uL (ref 0.0–0.2)
Basos: 1 %
EOS (ABSOLUTE): 0.1 x10E3/uL (ref 0.0–0.4)
Eos: 1 %
Hematocrit: 44.2 % (ref 34.0–46.6)
Hemoglobin: 14.7 g/dL (ref 11.1–15.9)
Immature Grans (Abs): 0 x10E3/uL (ref 0.0–0.1)
Immature Granulocytes: 0 %
Lymphocytes Absolute: 1.9 x10E3/uL (ref 0.7–3.1)
Lymphs: 31 %
MCH: 31.1 pg (ref 26.6–33.0)
MCHC: 33.3 g/dL (ref 31.5–35.7)
MCV: 93 fL (ref 79–97)
Monocytes Absolute: 0.5 x10E3/uL (ref 0.1–0.9)
Monocytes: 8 %
Neutrophils Absolute: 3.6 x10E3/uL (ref 1.4–7.0)
Neutrophils: 59 %
Platelets: 298 x10E3/uL (ref 150–450)
RBC: 4.73 x10E6/uL (ref 3.77–5.28)
RDW: 11.6 % — ABNORMAL LOW (ref 11.7–15.4)
WBC: 6.1 x10E3/uL (ref 3.4–10.8)

## 2024-09-03 LAB — LIPID PANEL W/O CHOL/HDL RATIO
Cholesterol, Total: 172 mg/dL (ref 100–199)
HDL: 45 mg/dL (ref >39)
LDL Chol Calc (NIH): 110 mg/dL — ABNORMAL HIGH (ref 0–99)
Triglycerides: 90 mg/dL (ref 0–149)
VLDL Cholesterol Cal: 17 mg/dL (ref 5–40)

## 2024-09-03 LAB — CMP14+EGFR
ALT: 57 IU/L — ABNORMAL HIGH (ref 0–32)
AST: 32 IU/L (ref 0–40)
Albumin: 4.5 g/dL (ref 3.8–4.9)
Alkaline Phosphatase: 104 IU/L (ref 49–135)
BUN/Creatinine Ratio: 22 (ref 9–23)
BUN: 15 mg/dL (ref 6–24)
Bilirubin Total: 0.3 mg/dL (ref 0.0–1.2)
CO2: 26 mmol/L (ref 20–29)
Calcium: 9.7 mg/dL (ref 8.7–10.2)
Chloride: 102 mmol/L (ref 96–106)
Creatinine, Ser: 0.67 mg/dL (ref 0.57–1.00)
Globulin, Total: 2.7 g/dL (ref 1.5–4.5)
Glucose: 94 mg/dL (ref 70–99)
Potassium: 4.2 mmol/L (ref 3.5–5.2)
Sodium: 141 mmol/L (ref 134–144)
Total Protein: 7.2 g/dL (ref 6.0–8.5)
eGFR: 103 mL/min/1.73 (ref >59)

## 2024-09-03 LAB — HEMOGLOBIN A1C
Est. average glucose Bld gHb Est-mCnc: 120 mg/dL
Hgb A1c MFr Bld: 5.8 % — ABNORMAL HIGH (ref 4.8–5.6)

## 2024-09-03 LAB — TSH: TSH: 0.852 u[IU]/mL (ref 0.450–4.500)

## 2024-09-03 NOTE — Progress Notes (Signed)
Pt informed

## 2024-09-26 ENCOUNTER — Other Ambulatory Visit: Payer: Self-pay | Admitting: Internal Medicine

## 2024-09-26 DIAGNOSIS — M542 Cervicalgia: Secondary | ICD-10-CM

## 2024-09-26 DIAGNOSIS — M79672 Pain in left foot: Secondary | ICD-10-CM

## 2024-09-26 MED ORDER — BACLOFEN 10 MG PO TABS: 10.0000 mg | ORAL_TABLET | ORAL | 1 refills | Status: AC | PRN

## 2024-12-04 ENCOUNTER — Ambulatory Visit: Admitting: Internal Medicine

## 2024-12-16 ENCOUNTER — Ambulatory Visit: Payer: Self-pay | Admitting: Internal Medicine

## 2024-12-22 ENCOUNTER — Ambulatory Visit: Payer: Self-pay | Admitting: Internal Medicine
